# Patient Record
Sex: Female | Born: 1935
Health system: Southern US, Community
[De-identification: ages and names within clinical notes are randomized; demographics above are authoritative.]

## PROBLEM LIST (undated history)

## (undated) DIAGNOSIS — H269 Unspecified cataract: Secondary | ICD-10-CM

## (undated) DIAGNOSIS — I10 Essential (primary) hypertension: Secondary | ICD-10-CM

## (undated) DIAGNOSIS — T7840XA Allergy, unspecified, initial encounter: Secondary | ICD-10-CM

## (undated) HISTORY — PX: FRACTURE SURGERY: SHX138

## (undated) HISTORY — DX: Unspecified cataract: H26.9

## (undated) HISTORY — PX: HERNIA REPAIR: SHX51

## (undated) HISTORY — DX: Allergy, unspecified, initial encounter: T78.40XA

---

## 1999-07-18 ENCOUNTER — Ambulatory Visit (HOSPITAL_BASED_OUTPATIENT_CLINIC_OR_DEPARTMENT_OTHER): Admission: RE | Admit: 1999-07-18 | Discharge: 1999-07-18 | Payer: Self-pay | Admitting: Surgery

## 2010-09-23 ENCOUNTER — Encounter: Admission: RE | Admit: 2010-09-23 | Discharge: 2010-09-23 | Payer: Self-pay | Admitting: Orthopedic Surgery

## 2010-09-26 ENCOUNTER — Ambulatory Visit (HOSPITAL_BASED_OUTPATIENT_CLINIC_OR_DEPARTMENT_OTHER): Admission: RE | Admit: 2010-09-26 | Discharge: 2010-09-26 | Payer: Self-pay | Admitting: Unknown Physician Specialty

## 2011-03-02 LAB — POCT HEMOGLOBIN-HEMACUE: Hemoglobin: 12.4 g/dL (ref 12.0–15.0)

## 2013-12-24 DIAGNOSIS — H259 Unspecified age-related cataract: Secondary | ICD-10-CM | POA: Diagnosis not present

## 2014-02-18 DIAGNOSIS — H2589 Other age-related cataract: Secondary | ICD-10-CM | POA: Diagnosis not present

## 2014-02-18 DIAGNOSIS — H18599 Other hereditary corneal dystrophies, unspecified eye: Secondary | ICD-10-CM | POA: Diagnosis not present

## 2014-03-23 DIAGNOSIS — H18599 Other hereditary corneal dystrophies, unspecified eye: Secondary | ICD-10-CM | POA: Diagnosis not present

## 2015-01-27 DIAGNOSIS — H25813 Combined forms of age-related cataract, bilateral: Secondary | ICD-10-CM | POA: Diagnosis not present

## 2015-01-27 DIAGNOSIS — H1859 Other hereditary corneal dystrophies: Secondary | ICD-10-CM | POA: Diagnosis not present

## 2015-06-24 ENCOUNTER — Ambulatory Visit (INDEPENDENT_AMBULATORY_CARE_PROVIDER_SITE_OTHER): Payer: Medicare Other | Admitting: Physician Assistant

## 2015-06-24 VITALS — BP 180/80 | HR 82 | Temp 97.8°F | Resp 18 | Ht 61.75 in | Wt 156.0 lb

## 2015-06-24 DIAGNOSIS — Z23 Encounter for immunization: Secondary | ICD-10-CM

## 2015-06-24 DIAGNOSIS — IMO0001 Reserved for inherently not codable concepts without codable children: Secondary | ICD-10-CM

## 2015-06-24 DIAGNOSIS — S91209A Unspecified open wound of unspecified toe(s) with damage to nail, initial encounter: Secondary | ICD-10-CM | POA: Diagnosis not present

## 2015-06-24 DIAGNOSIS — M79674 Pain in right toe(s): Secondary | ICD-10-CM

## 2015-06-24 DIAGNOSIS — R030 Elevated blood-pressure reading, without diagnosis of hypertension: Secondary | ICD-10-CM | POA: Diagnosis not present

## 2015-06-24 NOTE — Progress Notes (Signed)
06/25/2015 at 8:20 AM  Angela [name] / DOB: October 13, 1933 / MRN: [id number]  The patient  does not have a problem list on file.  SUBJECTIVE  Chief complaint: toe problem   Patient here today because she was getting out of the bath tube and rammed her toenail into the side of the tub.  Reports the proximal part of the nail was out of place and she just pushed it back down.  She could not control the bleeding and though it best to come here. She did try therapeutic grade lavender as a disinfectant.   She reports her blood pressure is improved today versus what it has been.  She has never been diagnosed with hypertension.  She is working with an Ship brokeracupuncturist and an [place] medicine professional to reduce her pressure. She refuses to take formal medication for this today, and does not want a work up and will not be returning for follow up.   She  has a past medical history of Allergy and Cataract.    Medications reviewed and updated by myself where necessary, and exist elsewhere in the encounter.   Angela [name] has No Known Allergies. She  reports that she has never smoked. She does not have any smokeless tobacco history on file. She reports that she does not drink alcohol or use illicit drugs. She  has no sexual activity history on file. The patient  has past surgical history that includes Fracture surgery and Hernia repair.  Her family history is not on file.  Review of Systems  Constitutional: Negative for fever and chills.  Eyes: Negative for blurred vision.  Cardiovascular: Negative for chest pain.  Gastrointestinal: Negative for nausea.  Skin: Negative for rash.  Neurological: Negative for dizziness and headaches.  Endo/Heme/Allergies: Does not bruise/bleed easily.    OBJECTIVE  Her  height is 5' 1.75" (1.568 m) and weight is 156 lb (70.761 kg). Her oral temperature is 97.8 F (36.6 C). Her blood pressure is 180/80 and her pulse is 82. Her respiration is 18 and oxygen saturation is  97%.  The patient's body mass index is 28.78 kg/(m^2).  Physical Exam  Vitals reviewed. Constitutional: She is oriented to person, place, and time. She appears well-developed and well-nourished. No distress.  Cardiovascular: Normal rate and regular rhythm.   Respiratory: Effort normal.  Musculoskeletal:       Feet:  Neurological: She is alert and oriented to person, place, and time.  Skin: Skin is warm and dry. She is not diaphoretic.   Procedure: Verbal consent obtained.  Patient anesthetized with 2% lidocaine without via digital nerve block. Sterile prep and drape. Right great toenail lifting and removed.  Investigation of the nail bed reveals no lacerations or retained toenail. Xeroform gauze placed.  Two lacerations repaired with 4-0 vicryl and hemostasis achieved.    No results found for this or any previous visit (from the past 24 hour(s)).  ASSESSMENT & PLAN  Angela [name] was seen today for toe problem.  Diagnoses and all orders for this visit:  Nail avulsion, toe, initial encounter Orders: -     Nail removal  Pain of toe of right foot: Managed with problem 1.   Elevated BP:  Patient does not want a workup, medication, or follow up for this problem.  I have strongly advised against doing nothing.    Need for prophylactic vaccination with combined diphtheria-tetanus-pertussis (DTP) vaccine Orders: -     Tdap vaccine greater than or equal to 7yo IM  The patient was advised to call or come back to clinic if she does not see an improvement in symptoms, or worsens with the above plan.   Deliah Boston, MHS, PA-C [facility] 06/25/2015 8:20 AM

## 2016-01-28 DIAGNOSIS — I10 Essential (primary) hypertension: Secondary | ICD-10-CM | POA: Diagnosis not present

## 2016-10-13 DIAGNOSIS — H2513 Age-related nuclear cataract, bilateral: Secondary | ICD-10-CM | POA: Diagnosis not present

## 2017-05-09 ENCOUNTER — Encounter (HOSPITAL_COMMUNITY): Payer: Self-pay | Admitting: Internal Medicine

## 2017-05-09 ENCOUNTER — Emergency Department (HOSPITAL_COMMUNITY): Payer: Medicare Other

## 2017-05-09 ENCOUNTER — Inpatient Hospital Stay (HOSPITAL_COMMUNITY)
Admission: EM | Admit: 2017-05-09 | Discharge: 2017-05-13 | DRG: 469 | Disposition: A | Discharge status: Skilled Nursing Facility | Payer: Medicare Other | Attending: Internal Medicine | Admitting: Internal Medicine

## 2017-05-09 DIAGNOSIS — S72031A Displaced midcervical fracture of right femur, initial encounter for closed fracture: Principal | ICD-10-CM | POA: Diagnosis present

## 2017-05-09 DIAGNOSIS — Z833 Family history of diabetes mellitus: Secondary | ICD-10-CM | POA: Diagnosis not present

## 2017-05-09 DIAGNOSIS — M79604 Pain in right leg: Secondary | ICD-10-CM

## 2017-05-09 DIAGNOSIS — Z823 Family history of stroke: Secondary | ICD-10-CM

## 2017-05-09 DIAGNOSIS — E1165 Type 2 diabetes mellitus with hyperglycemia: Secondary | ICD-10-CM | POA: Diagnosis present

## 2017-05-09 DIAGNOSIS — R0600 Dyspnea, unspecified: Secondary | ICD-10-CM

## 2017-05-09 DIAGNOSIS — R410 Disorientation, unspecified: Secondary | ICD-10-CM | POA: Diagnosis not present

## 2017-05-09 DIAGNOSIS — S72001A Fracture of unspecified part of neck of right femur, initial encounter for closed fracture: Secondary | ICD-10-CM | POA: Diagnosis not present

## 2017-05-09 DIAGNOSIS — E876 Hypokalemia: Secondary | ICD-10-CM | POA: Diagnosis present

## 2017-05-09 DIAGNOSIS — J189 Pneumonia, unspecified organism: Secondary | ICD-10-CM | POA: Diagnosis present

## 2017-05-09 DIAGNOSIS — IMO0002 Reserved for concepts with insufficient information to code with codable children: Secondary | ICD-10-CM

## 2017-05-09 DIAGNOSIS — R221 Localized swelling, mass and lump, neck: Secondary | ICD-10-CM

## 2017-05-09 DIAGNOSIS — Z881 Allergy status to other antibiotic agents status: Secondary | ICD-10-CM | POA: Diagnosis not present

## 2017-05-09 DIAGNOSIS — Y92003 Bedroom of unspecified non-institutional (private) residence as the place of occurrence of the external cause: Secondary | ICD-10-CM | POA: Diagnosis not present

## 2017-05-09 DIAGNOSIS — D72829 Elevated white blood cell count, unspecified: Secondary | ICD-10-CM | POA: Diagnosis present

## 2017-05-09 DIAGNOSIS — W010XXA Fall on same level from slipping, tripping and stumbling without subsequent striking against object, initial encounter: Secondary | ICD-10-CM | POA: Diagnosis present

## 2017-05-09 DIAGNOSIS — R229 Localized swelling, mass and lump, unspecified: Secondary | ICD-10-CM | POA: Diagnosis present

## 2017-05-09 DIAGNOSIS — I248 Other forms of acute ischemic heart disease: Secondary | ICD-10-CM | POA: Diagnosis present

## 2017-05-09 DIAGNOSIS — Z09 Encounter for follow-up examination after completed treatment for conditions other than malignant neoplasm: Secondary | ICD-10-CM

## 2017-05-09 DIAGNOSIS — I10 Essential (primary) hypertension: Secondary | ICD-10-CM | POA: Diagnosis present

## 2017-05-09 DIAGNOSIS — Z9114 Patient's other noncompliance with medication regimen: Secondary | ICD-10-CM

## 2017-05-09 DIAGNOSIS — R748 Abnormal levels of other serum enzymes: Secondary | ICD-10-CM | POA: Diagnosis not present

## 2017-05-09 DIAGNOSIS — S72009A Fracture of unspecified part of neck of unspecified femur, initial encounter for closed fracture: Secondary | ICD-10-CM | POA: Diagnosis present

## 2017-05-09 DIAGNOSIS — R739 Hyperglycemia, unspecified: Secondary | ICD-10-CM

## 2017-05-09 DIAGNOSIS — E039 Hypothyroidism, unspecified: Secondary | ICD-10-CM | POA: Diagnosis not present

## 2017-05-09 DIAGNOSIS — E785 Hyperlipidemia, unspecified: Secondary | ICD-10-CM | POA: Diagnosis present

## 2017-05-09 DIAGNOSIS — I340 Nonrheumatic mitral (valve) insufficiency: Secondary | ICD-10-CM | POA: Diagnosis not present

## 2017-05-09 DIAGNOSIS — E1169 Type 2 diabetes mellitus with other specified complication: Secondary | ICD-10-CM

## 2017-05-09 DIAGNOSIS — R938 Abnormal findings on diagnostic imaging of other specified body structures: Secondary | ICD-10-CM

## 2017-05-09 DIAGNOSIS — R778 Other specified abnormalities of plasma proteins: Secondary | ICD-10-CM | POA: Diagnosis present

## 2017-05-09 DIAGNOSIS — R011 Cardiac murmur, unspecified: Secondary | ICD-10-CM | POA: Diagnosis present

## 2017-05-09 DIAGNOSIS — I700 Atherosclerosis of aorta: Secondary | ICD-10-CM | POA: Diagnosis present

## 2017-05-09 DIAGNOSIS — Z419 Encounter for procedure for purposes other than remedying health state, unspecified: Secondary | ICD-10-CM

## 2017-05-09 DIAGNOSIS — E860 Dehydration: Secondary | ICD-10-CM | POA: Diagnosis not present

## 2017-05-09 DIAGNOSIS — Z01810 Encounter for preprocedural cardiovascular examination: Secondary | ICD-10-CM | POA: Diagnosis not present

## 2017-05-09 DIAGNOSIS — R7989 Other specified abnormal findings of blood chemistry: Secondary | ICD-10-CM | POA: Diagnosis present

## 2017-05-09 DIAGNOSIS — R9389 Abnormal findings on diagnostic imaging of other specified body structures: Secondary | ICD-10-CM | POA: Diagnosis present

## 2017-05-09 HISTORY — DX: Essential (primary) hypertension: I10

## 2017-05-09 LAB — URINALYSIS, ROUTINE W REFLEX MICROSCOPIC
Bilirubin Urine: NEGATIVE
Ketones, ur: NEGATIVE mg/dL
NITRITE: NEGATIVE
PH: 5 (ref 5.0–8.0)
Protein, ur: 100 mg/dL — AB
SPECIFIC GRAVITY, URINE: 1.025 (ref 1.005–1.030)

## 2017-05-09 LAB — BASIC METABOLIC PANEL
ANION GAP: 9 (ref 5–15)
BUN: 38 mg/dL — ABNORMAL HIGH (ref 6–20)
CALCIUM: 9.4 mg/dL (ref 8.9–10.3)
CHLORIDE: 101 mmol/L (ref 101–111)
CO2: 26 mmol/L (ref 22–32)
CREATININE: 0.81 mg/dL (ref 0.44–1.00)
GFR calc Af Amer: >60 mL/min (ref >60)
GFR calc non Af Amer: >60 mL/min (ref >60)
GLUCOSE: 271 mg/dL — AB (ref 65–99)
Potassium: 4.2 mmol/L (ref 3.5–5.1)
Sodium: 136 mmol/L (ref 135–145)

## 2017-05-09 LAB — CBC
HEMATOCRIT: 44.6 % (ref 36.0–46.0)
HEMOGLOBIN: 15.5 g/dL — AB (ref 12.0–15.0)
MCH: 30.9 pg (ref 26.0–34.0)
MCHC: 34.8 g/dL (ref 30.0–36.0)
MCV: 88.8 fL (ref 78.0–100.0)
Platelets: 355 10*3/uL (ref 150–400)
RBC: 5.02 MIL/uL (ref 3.87–5.11)
RDW: 12.7 % (ref 11.5–15.5)
WBC: 18.4 10*3/uL — ABNORMAL HIGH (ref 4.0–10.5)

## 2017-05-09 LAB — TROPONIN I: TROPONIN I: 0.16 ng/mL — AB (ref <0.03)

## 2017-05-09 LAB — GLUCOSE, CAPILLARY: Glucose-Capillary: 337 mg/dL — ABNORMAL HIGH (ref 65–99)

## 2017-05-09 LAB — CK: Total CK: 210 U/L (ref 38–234)

## 2017-05-09 LAB — TSH: TSH: 5.657 u[IU]/mL — ABNORMAL HIGH (ref 0.350–4.500)

## 2017-05-09 LAB — MAGNESIUM: Magnesium: 2.7 mg/dL — ABNORMAL HIGH (ref 1.7–2.4)

## 2017-05-09 LAB — PHOSPHORUS: PHOSPHORUS: 3.5 mg/dL (ref 2.5–4.6)

## 2017-05-09 MED ORDER — DEXTROSE 5 % IV SOLN
1.0000 g | INTRAVENOUS | Status: DC
  Administered 2017-05-09 – 2017-05-11 (×3): 1 g via INTRAVENOUS
  Filled 2017-05-09 (×3): qty 10

## 2017-05-09 MED ORDER — GUAIFENESIN ER 600 MG PO TB12
600.0000 mg | ORAL_TABLET | Freq: Two times a day (BID) | ORAL | Status: DC
  Administered 2017-05-09 – 2017-05-13 (×6): 600 mg via ORAL
  Filled 2017-05-09 (×7): qty 1

## 2017-05-09 MED ORDER — POVIDONE-IODINE 10 % EX SWAB: 2.0000 "application " | Freq: Once | CUTANEOUS | Status: DC

## 2017-05-09 MED ORDER — INSULIN ASPART 100 UNIT/ML ~~LOC~~ SOLN
0.0000 [IU] | SUBCUTANEOUS | Status: DC
  Administered 2017-05-09: 7 [IU] via SUBCUTANEOUS
  Administered 2017-05-10: 3 [IU] via SUBCUTANEOUS
  Administered 2017-05-10: 2 [IU] via SUBCUTANEOUS
  Administered 2017-05-10: 7 [IU] via SUBCUTANEOUS
  Administered 2017-05-10: 1 [IU] via SUBCUTANEOUS
  Administered 2017-05-11 (×2): 2 [IU] via SUBCUTANEOUS
  Administered 2017-05-11: 3 [IU] via SUBCUTANEOUS
  Administered 2017-05-11: 2 [IU] via SUBCUTANEOUS
  Administered 2017-05-11: 3 [IU] via SUBCUTANEOUS
  Administered 2017-05-11 – 2017-05-12 (×5): 2 [IU] via SUBCUTANEOUS
  Administered 2017-05-12: 3 [IU] via SUBCUTANEOUS
  Administered 2017-05-12 – 2017-05-13 (×5): 2 [IU] via SUBCUTANEOUS

## 2017-05-09 MED ORDER — ACETAMINOPHEN 325 MG PO TABS
650.0000 mg | ORAL_TABLET | Freq: Four times a day (QID) | ORAL | Status: DC | PRN
Start: 2017-05-09 — End: 2017-05-13

## 2017-05-09 MED ORDER — DEXTROSE 5 % IV SOLN
500.0000 mg | INTRAVENOUS | Status: DC
  Administered 2017-05-09 – 2017-05-11 (×3): 500 mg via INTRAVENOUS
  Filled 2017-05-09 (×3): qty 500

## 2017-05-09 MED ORDER — PROPOFOL 10 MG/ML IV BOLUS
INTRAVENOUS | Status: AC
  Filled 2017-05-09: qty 20

## 2017-05-09 MED ORDER — FENTANYL CITRATE (PF) 100 MCG/2ML IJ SOLN
INTRAMUSCULAR | Status: AC
  Filled 2017-05-09: qty 2

## 2017-05-09 MED ORDER — IOPAMIDOL (ISOVUE-370) INJECTION 76%
INTRAVENOUS | Status: AC
  Filled 2017-05-09: qty 100

## 2017-05-09 MED ORDER — HYDROCODONE-ACETAMINOPHEN 5-325 MG PO TABS: 1.0000 | ORAL_TABLET | Freq: Four times a day (QID) | ORAL | Status: DC | PRN

## 2017-05-09 MED ORDER — ONDANSETRON HCL 4 MG PO TABS: 4.0000 mg | ORAL_TABLET | Freq: Four times a day (QID) | ORAL | Status: DC | PRN

## 2017-05-09 MED ORDER — ACETAMINOPHEN 650 MG RE SUPP: 650.0000 mg | Freq: Four times a day (QID) | RECTAL | Status: DC | PRN

## 2017-05-09 MED ORDER — METHOCARBAMOL 500 MG PO TABS: 500.0000 mg | ORAL_TABLET | Freq: Four times a day (QID) | ORAL | Status: DC | PRN

## 2017-05-09 MED ORDER — CEFAZOLIN SODIUM-DEXTROSE 2-4 GM/100ML-% IV SOLN: 2.0000 g | INTRAVENOUS | Status: DC

## 2017-05-09 MED ORDER — PREDNISOLONE ACETATE 1 % OP SUSP
1.0000 [drp] | Freq: Four times a day (QID) | OPHTHALMIC | Status: DC
  Administered 2017-05-09 – 2017-05-13 (×8): 1 [drp] via OPHTHALMIC
  Filled 2017-05-09: qty 5

## 2017-05-09 MED ORDER — MIDAZOLAM HCL 2 MG/2ML IJ SOLN
INTRAMUSCULAR | Status: AC
  Filled 2017-05-09: qty 2

## 2017-05-09 MED ORDER — ENSURE ENLIVE PO LIQD
237.0000 mL | Freq: Two times a day (BID) | ORAL | Status: DC
  Administered 2017-05-13: 237 mL via ORAL

## 2017-05-09 MED ORDER — IOPAMIDOL (ISOVUE-370) INJECTION 76%
100.0000 mL | Freq: Once | INTRAVENOUS | Status: AC | PRN
Start: 2017-05-09 — End: 2017-05-09
  Administered 2017-05-09: 100 mL via INTRAVENOUS

## 2017-05-09 MED ORDER — MORPHINE SULFATE (PF) 4 MG/ML IV SOLN: 0.5000 mg | INTRAVENOUS | Status: DC | PRN

## 2017-05-09 MED ORDER — SODIUM CHLORIDE 0.9 % IV SOLN
INTRAVENOUS | Status: AC
  Administered 2017-05-09 – 2017-05-10 (×2): via INTRAVENOUS

## 2017-05-09 MED ORDER — CHLORHEXIDINE GLUCONATE 4 % EX LIQD
60.0000 mL | Freq: Once | CUTANEOUS | Status: AC
  Administered 2017-05-09: 4 via TOPICAL
  Filled 2017-05-09: qty 60

## 2017-05-09 MED ORDER — METHOCARBAMOL 1000 MG/10ML IJ SOLN
500.0000 mg | Freq: Four times a day (QID) | INTRAMUSCULAR | Status: DC | PRN
  Administered 2017-05-10: 500 mg via INTRAVENOUS
  Filled 2017-05-09 (×2): qty 5

## 2017-05-09 MED ORDER — ACETAMINOPHEN 10 MG/ML IV SOLN: 1000.0000 mg | INTRAVENOUS | Status: DC

## 2017-05-09 MED ORDER — ONDANSETRON HCL 4 MG/2ML IJ SOLN: 4.0000 mg | Freq: Four times a day (QID) | INTRAMUSCULAR | Status: DC | PRN

## 2017-05-09 MED ORDER — CEFAZOLIN SODIUM 10 G IJ SOLR: 3.0000 g | INTRAMUSCULAR | Status: DC

## 2017-05-09 MED ORDER — SODIUM CHLORIDE 0.9 % IV BOLUS (SEPSIS)
1000.0000 mL | Freq: Once | INTRAVENOUS | Status: AC
  Administered 2017-05-09: 1000 mL via INTRAVENOUS

## 2017-05-09 MED ORDER — HYDROCODONE-ACETAMINOPHEN 5-325 MG PO TABS: 1.0000 | ORAL_TABLET | ORAL | Status: DC | PRN

## 2017-05-09 MED ORDER — SODIUM CHLORIDE 0.9 % IV SOLN
1000.0000 mg | INTRAVENOUS | Status: DC
  Filled 2017-05-09: qty 10

## 2017-05-09 MED ORDER — SODIUM CHLORIDE 0.9% FLUSH
3.0000 mL | Freq: Two times a day (BID) | INTRAVENOUS | Status: DC
  Administered 2017-05-09: 3 mL via INTRAVENOUS

## 2017-05-09 NOTE — ED Notes (Signed)
Date and time results received: 05/09/17 1950 (use smartphrase ".now" to insert current time)  Test: Troponin Critical Value: Troponin 0.16  Name of Provider Notified: Joselyn Glassmanyler  Orders Received? Or Actions Taken?: None

## 2017-05-09 NOTE — ED Notes (Signed)
Bed: WA21 Expected date:  Expected time:  Means of arrival:  Comments: [age] y/o F fall-laid in floor for 3 days

## 2017-05-09 NOTE — Progress Notes (Signed)
CSW attempted to call [name], the pt's son at [phone] and left VM.  Per the pt, the pt's son lives in [place].  VM indicates this was the phone for [name].  CSW gave the pt her son's number on a piece of paper.  Pt left her phone at her home with relatives numbers.  CSW completed assessment.  [name], Theresia MajorsLCSWA, LCAS Clinical Social Worker Ph: [phone]

## 2017-05-09 NOTE — ED Notes (Signed)
Pt. Documented in error DG Chest 2 View. 

## 2017-05-09 NOTE — ED Notes (Signed)
Writer attempted to get urine from pt w/ a femal urinal.  Unsuccessful X 2

## 2017-05-09 NOTE — ED Provider Notes (Signed)
[facility]-EMERGENCY DEPT Provider Note   CSN: [id number] Arrival date & time: 05/09/17  1511     History   Chief Complaint Chief Complaint  Patient presents with  . Fall    HPI Angela CANADY is a 83 y.o. female.  HPI  83 y.o. female, presents to the Emergency Department today due to mechanical fall. Pt states that she was ambulating her her bedroom when she tripped on the carpet and landed on her right hip. Noted pain with movement and attempted ambulation, but was unable to do so. She remained on the ground for duration of time at home. Pt neighbor noticed newspapers piling up and decided to call EMS who found patient prone on ground. Noted shortening of right leg. Pt states she is not in pain unless the hip is moved. Rates 5/10 and sharp sensation. Denied head trauma or LOC during fall. Pt is not on blood thinners. Pt has not eaten or drank anything since fall that evening. Pt denies CP/SOB/ABD pain. No N/V/D. No fevers. No other symptoms noted  Past Medical History:  Diagnosis Date  . Allergy   . Cataract     There are no active problems to display for this patient.   Past Surgical History:  Procedure Laterality Date  . FRACTURE SURGERY    . HERNIA REPAIR      OB History    No data available       Home Medications    Prior to Admission medications   Not on File    Family History No family history on file.  Social History Social History  Substance Use Topics  . Smoking status: Never Smoker  . Smokeless tobacco: Not on file  . Alcohol use No     Allergies   Patient has no known allergies.   Review of Systems Review of Systems ROS reviewed and all are negative for acute change except as noted in the HPI.  Physical Exam Updated Vital Signs BP (!) 152/76 (BP Location: Right Arm)   Pulse 91   Temp 97.5 F (36.4 C) (Oral)   Resp 18   Ht 5\' 2"  (1.575 m)   SpO2 93%   Physical Exam  Constitutional: She is oriented to person, place, and time. Vital  signs are normal. She appears well-developed and well-nourished.  HENT:  Head: Normocephalic and atraumatic.  Right Ear: Hearing normal.  Left Ear: Hearing normal.  Eyes: Conjunctivae and EOM are normal. Pupils are equal, round, and reactive to light.  Neck: Normal range of motion. Neck supple.  Cardiovascular: Normal rate, regular rhythm and intact distal pulses.   Murmur heard. Pulmonary/Chest: Effort normal and breath sounds normal. She has no decreased breath sounds. She has no wheezes. She has no rhonchi. She has no rales.  Abdominal: Soft. Bowel sounds are normal. There is no tenderness. There is no rigidity, no rebound, no guarding, no CVA tenderness, no tenderness at McBurney's point and negative Murphy's sign.  Musculoskeletal: Normal range of motion.  Mild right leg shortening compared to left. NVI with distal pulses appreciated. Pain with internal/external rotation of right hip join. X3 other extremities with full ROM without pain. No obvious palpable or visible deformities   Neurological: She is alert and oriented to person, place, and time. She has normal strength. No cranial nerve deficit or sensory deficit.  Skin: Skin is warm and dry.  Psychiatric: She has a normal mood and affect. Her speech is normal and behavior is normal. Thought content normal.  Nursing note and vitals reviewed.  ED Treatments / Results  Labs (all labs ordered are listed, but only abnormal results are displayed) Labs Reviewed  CBC - Abnormal; Notable for the following:       Result Value   WBC 18.4 (*)    Hemoglobin 15.5 (*)    All other components within normal limits  BASIC METABOLIC PANEL - Abnormal; Notable for the following:    Glucose, Bld 271 (*)    BUN 38 (*)    All other components within normal limits  CK  URINALYSIS, ROUTINE W REFLEX MICROSCOPIC    EKG  EKG Interpretation  Date/Time:  Wednesday May 09 2017 15:27:11 EDT Ventricular Rate:  94 PR Interval:    QRS Duration: 87 QT  Interval:  357 QTC Calculation: 447 R Axis:   -42 Text Interpretation:  Sinus rhythm Left atrial enlargement Abnormal R-wave progression, early transition Left ventricular hypertrophy Inferior infarct, old When comapred to prior, no significant changes from prior.  No STEMI Confirmed by Theda Belfastegeler, Chris ([id number]) on 05/09/2017 6:08:20 PM       Radiology Dg Chest 2 View  Result Date: 05/09/2017 CLINICAL DATA:  Fall, fell on Sunday evening and was found today in prone position with shortening and rotation of the RIGHT leg, RIGHT hip pain radiating to knee EXAM: CHEST  2 VIEW COMPARISON:  09/23/2010 FINDINGS: Normal heart size, mediastinal contours, and pulmonary vascularity. Scattered atelectasis versus infiltrate in LEFT lung. Unable to exclude LEFT perihilar mass with this appearance. Tiny LEFT pleural effusion blunts the lateral costophrenic angle. Remaining lungs clear. No pneumothorax. Atherosclerotic calcification aorta. Bones demineralized. IMPRESSION: Scattered infiltrate versus atelectasis in LEFT lung, unable to exclude LEFT perihilar mass; when the patient's clinical condition permits, recommend follow-up CT chest with contrast to exclude neoplasm. Electronically Signed   By: Ulyses SouthwardMark  Boles M.D.   On: 05/09/2017 17:47   Dg Knee Complete 4 Views Right  Result Date: 05/09/2017 CLINICAL DATA:  Fall, fell on Sunday evening and was found today in prone position with shortening and rotation of the RIGHT leg, RIGHT hip pain radiating to knee EXAM: RIGHT KNEE - COMPLETE 4+ VIEW COMPARISON:  None FINDINGS: Diffuse osseous demineralization. Medial compartment joint space narrowing spur formation. Mild chondrocalcinosis in medial compartment question CPPD. No acute fracture, dislocation, or bone destruction. No knee joint effusion. IMPRESSION: Osseous demineralization with mild degenerative changes and question CPPD RIGHT knee. No acute abnormalities. Please refer to report of RIGHT hip radiograph exam.  Electronically Signed   By: Ulyses SouthwardMark  Boles M.D.   On: 05/09/2017 17:45   Dg Hip Unilat W Or Wo Pelvis 2-3 Views Right  Result Date: 05/09/2017 CLINICAL DATA:  Fall EXAM: DG HIP (WITH OR WITHOUT PELVIS) 2-3V RIGHT COMPARISON:  None. FINDINGS: Old appearing right inferior pubic ramus fracture. Pubic symphysis is intact. No right femoral head dislocation. Foreshortened appearance of the right femoral neck with suspected transcervical fracture. IMPRESSION: 1. Foreshortened appearance of the right femoral neck with suspected transcervical fracture. 2. Old appearing right inferior pubic ramus fracture Electronically Signed   By: Jasmine PangKim  Fujinaga M.D.   On: 05/09/2017 17:45   Dg Femur, Min 2 Views Right  Result Date: 05/09/2017 CLINICAL DATA:  Fall, fell on Sunday evening and was found today in prone position with shortening and rotation of the RIGHT leg, RIGHT hip pain radiating to knee EXAM: RIGHT FEMUR 2 VIEWS COMPARISON:  None FINDINGS: Osseous mineralization. RIGHT hip imaged and reported separately. Remainder of RIGHT femur appears intact without  fracture or dislocation. Knee joint alignment grossly normal. IMPRESSION: Osseous demineralization without acute abnormalities. Please refer to report of RIGHT hip radiographs. Electronically Signed   By: Ulyses Southward M.D.   On: 05/09/2017 17:44    Procedures Procedures (including critical care time)  Medications Ordered in ED Medications  sodium chloride 0.9 % bolus 1,000 mL (1,000 mLs Intravenous New Bag/Given 05/09/17 1635)   Initial Impression / Assessment and Plan / ED Course  I have reviewed the triage vital signs and the nursing notes.  Pertinent labs & imaging results that were available during my care of the patient were reviewed by me and considered in my medical decision making (see chart for details).  Final Clinical Impressions(s) / ED Diagnoses  {I have reviewed and evaluated the relevant laboratory values. {I have reviewed and evaluated the  relevant imaging studies.  {I have reviewed the relevant previous healthcare records.  {I obtained HPI from historian. {Patient discussed with supervising physician.  ED Course:  Assessment: Pt is a 83 y.o. female who presents with due to mechanical fall on Sunday evening with right hip pain. Noted prone position until EMS arrived today. Denies pain currently. No N/V/D. No CP/SOB/ABD pain. Notes pain with ROM of right hip with internal external rotation. Denies headache/dizziness. On exam, pt in NAD. Nontoxic/nonseptic appearing. VSS. Afebrile. Lungs CTA. Heart RRR. Abdomen nontender soft. Right hip with mild shortening. NVI with distal pulses appreciated. Concern for AKI due to prone positioning and dehydration. CK 210. CBC with leukocytosis noted, likely from acute fracture. BMP unremarkable. DG Right Hip shows right femoral neck fracture with suspected transcervical fracture. CXR shows atelectasis vs infiltrate. Pt afebrile and without cough or shortness of breath. Likely atelectasis. Consult to Orthopedics (Dr. Linna Caprice) will plan on operation tonight. Seen by supervising physician. Plan is to Admit to medicine.   Disposition/Plan:  Admit Pt acknowledges and agrees with plan  Supervising Physician Tegeler, [name] Brim, *  Final diagnoses:  Closed fracture of neck of right femur, initial encounter [facility])    New Prescriptions New Prescriptions   No medications on file     Audry Pili, Cordelia Poche 05/09/17 1850    Tegeler, [name] Brim, MD 05/16/17 1227

## 2017-05-09 NOTE — Clinical Social Work Note (Addendum)
Clinical Social Work Assessment  Patient Details  Name: Angela [name] MRN: [id number] Date of Birth: Nov 28, 1933  Date of referral:  05/09/17               Reason for consult:  Facility Placement                Permission sought to share information with:  Facility Sport and exercise psychologist, Family Supports Permission granted to share information::  Yes, Verbal Permission Granted  Name::        Agency::     Relationship::     Contact Information:     Housing/Transportation Living arrangements for the past 2 months:  Single Family Home Source of Information:  Patient Patient Interpreter Needed:  None Criminal Activity/Legal Involvement Pertinent to Current Situation/Hospitalization:    Significant Relationships:  Adult Children Lives with:  Self Do you feel safe going back to the place where you live?  Yes Need for family participation in patient care:  Yes (Comment)  Care giving concerns:  None listed by pt/family    Social Worker assessment / plan:  CSW met with pt and confirmed pt's plan to be discharged to SNF to live at discharge if PT recommends.  CSW provided active listening and validated pt's concerns that SNF be as near to her home near [address] as possible.  CSW noted [name] seems to be nearest to pt's home   Pt gave CSW Dept permission to complete FL-2 and send referrals out to SNF facilities via the hub per pt's request if PT recommends.  Pt has been living independently prior to being [facility] to [name] at her home since the early 1980's. CSW attempted to call Angela [name], the pt's son at [phone] and left VM.  Per the pt, the pt's son lives in [place].  VM indicates this was the phone for [name]  CSW gave the pt her son's number on a piece of paper.  Pt left her phone at her home with relatives numbers.    Employment status:  Retired Nurse, adult PT Recommendations:  Not assessed at this time Information / Referral  to community resources:     Patient/Family's Response to care:  Patient alert and oriented except fpr being unsure where she was in the hospital and in what city.  This may be attributable to weakness since pt fell on Sunday 5/20 and was only found by EMS on 5/23 when neighbors called EMS due to newspapers piling up outside pt's door .  Patient agreeable to plan.  Pt's son supportive and strongly involved in pt.'s care, per pt.  Pt  pleasant and appreciated CSW intervention.    Patient/Family's Understanding of and Emotional Response to Diagnosis, Current Treatment, and Prognosis: Still assessing   Emotional Assessment Appearance:  Appears stated age Attitude/Demeanor/Rapport:    Affect (typically observed):  Accepting, Adaptable, Calm, Appropriate Orientation:  Oriented to Self, Oriented to  Time, Oriented to Situation Alcohol / Substance use:    Psych involvement (Current and /or in the community):     Discharge Needs  Concerns to be addressed:    Readmission within the last 30 days:  No Current discharge risk:  None Barriers to Discharge:  No Barriers Identified   Angela [name], LCSWA 05/09/2017, 8:55 PM

## 2017-05-09 NOTE — H&P (Addendum)
Angela [name]:[id number] DOB: 10/12/1933 DOA: 05/09/2017     PCP: Patient, No Pcp Per   Outpatient Specialists: none  Patient coming from:  home Lives alone,   Chief Complaint: found down with right hip pain  HPI: Angela [name] is a 83 y.o. female with medical history significant of hypertesion     Presented with a mechanical fall 3 days ago was not able to get up. Apparently she tripped  in her bedroom and landed on her right hip she was unable to walk denies hitting her head or passing out. Patient is not taking any blood ferrous. Neighbor noticed that the newspapers for pilonidal on her door and called EMS patient's been laying in the sitting position since the fall for past 3 days. She was prone had shortening and rotation of the right leg.   Denies any history of chest pain shortness of breath abdominal pain and nausea vomiting no fevers no diarrhea. No dysuria. Patient unsure about her medical problems U records she has been diagnosed with hypertension in the past. Patient has been trying to control her blood pressure with acupuncture and [place] medicine she refuses to take any medications for this.  She reports some shortness of breath and dyspnea of exertion she is unable to walk up a flight of stairs due to shortness of breath. Been going on for the past 1 year.  She has never seen a heart doctor.   Regarding pertinent Chronic problems: Patient has history of hypertension but prefers [place] medicine   IN ER:  Temp (24hrs), Avg:97.5 F (36.4 C), Min:97.5 F (36.4 C), Max:97.5 F (36.4 C)  RR 1892% HR 89  BP 157/79 UA too numerous to count RBC and WBC + glucose, nitrate -, bacteria rare  NA 136  K 4.2  Glucose  271  Cr 0.81 CK 210  WBC 18.4 Hg 15.5  Right Hip: Likely femoral neck fracture Right knee - non acute  CXR: infiltrates vs atelectasis possible perihilar mass Following Medications were ordered in ER: Medications  sodium chloride 0.9 % bolus 1,000 mL  (1,000 mLs Intravenous New Bag/Given 05/09/17 1635)     ER provider discussed case with:Orthopedics who is planing to operate  Hospitalist was called for admission for Hip fracture  Review of Systems:    Pertinent positives include: fall, hip pain  Constitutional:  No weight loss, night sweats, Fevers, chills, fatigue, weight loss  HEENT:  No headaches, Difficulty swallowing,Tooth/dental problems,Sore throat,  No sneezing, itching, ear ache, nasal congestion, post nasal drip,  Cardio-vascular:  No chest pain, Orthopnea, PND, anasarca, dizziness, palpitations.no Bilateral lower extremity swelling  GI:  No heartburn, indigestion, abdominal pain, nausea, vomiting, diarrhea, change in bowel habits, loss of appetite, melena, blood in stool, hematemesis Resp:  no shortness of breath at rest. No dyspnea on exertion, No excess mucus, no productive cough, No non-productive cough, No coughing up of blood.No change in color of mucus.No wheezing. Skin:  no rash or lesions. No jaundice GU:  no dysuria, change in color of urine, no urgency or frequency. No straining to urinate.  No flank pain.  Musculoskeletal:  No joint pain or no joint swelling. No decreased range of motion. No back pain.  Psych:  No change in mood or affect. No depression or anxiety. No memory loss.  Neuro: no localizing neurological complaints, no tingling, no weakness, no double vision, no gait abnormality, no slurred speech, no confusion  As per HPI otherwise 10 point review of  systems negative.   Past Medical History: Past Medical History:  Diagnosis Date  . Allergy   . Cataract    Past Surgical History:  Procedure Laterality Date  . FRACTURE SURGERY    . HERNIA REPAIR       Social History:  Ambulatory independently      reports that she has never smoked. She does not have any smokeless tobacco history on file. She reports that she does not drink alcohol or use drugs.  Allergies:   Allergies    Allergen Reactions  . Epinephrine Other (See Comments)  . Other Other (See Comments)    Allergic to almost all RX-high powered antibiotics, anesthea used at dentist  . Phenylephrine Other (See Comments)    DO NO DILATE WITH PHENYLEPHRINE       Family History:   Family History  Problem Relation Age of Onset  . Stroke Mother   . Diabetes Sister   . CAD Neg Hx   . Hypertension Neg Hx   . Cancer Neg Hx     Medications: Prior to Admission medications   Not on File    Physical Exam: Patient Vitals for the past 24 hrs:  BP Temp Temp src Pulse Resp SpO2 Height  05/09/17 1811 (!) 157/79 - - 89 18 92 % -  05/09/17 1648 (!) 152/76 - - 91 18 93 % -  05/09/17 1528 - - - - - - 5\' 2"  (1.575 m)  05/09/17 1527 (!) 175/73 97.5 F (36.4 C) Oral 94 (!) 27 94 % -  05/09/17 1522 - - - - - 97 % -    1. General:  in No Acute distress 2. Psychological: Alert and   Oriented 3. Head/ENT:    Dry Mucous Membranes                          Head Non traumatic, neck supple                         Poor Dentition                           Pulsatile mass noted in   Anterior/right part of the neck 4. SKIN:  decreased Skin turgor,  Skin clean Dry and intact no rash 5. Heart: Regular rate and rhythm systolic  Murmur, Rub or gallop 6. Lungs: Clear to auscultation bilaterally, no wheezes or crackles   7. Abdomen: Soft, non-tender, Non distended 8. Lower extremities: no clubbing, cyanosis, or edema 9. Neurologically Grossly intact, moving all 4 extremities equally  10. MSK: Normal range of motion limited in right hip   body mass index is unknown because there is no height or weight on file.  Labs on Admission:   Labs on Admission: I have personally reviewed following labs and imaging studies  CBC:  Recent Labs Lab 05/09/17 1553  WBC 18.4*  HGB 15.5*  HCT 44.6  MCV 88.8  PLT 355   Basic Metabolic Panel:  Recent Labs Lab 05/09/17 1553  NA 136  K 4.2  CL 101  CO2 26  GLUCOSE 271*   BUN 38*  CREATININE 0.81  CALCIUM 9.4   GFR: CrCl cannot be calculated (Unknown ideal weight.). Liver Function Tests: No results for input(s): AST, ALT, ALKPHOS, BILITOT, PROT, ALBUMIN in the last 168 hours. No results for input(s): LIPASE, AMYLASE in the last 168 hours. No  results for input(s): AMMONIA in the last 168 hours. Coagulation Profile: No results for input(s): INR, PROTIME in the last 168 hours. Cardiac Enzymes:  Recent Labs Lab 05/09/17 1553  CKTOTAL 210   BNP (last 3 results) No results for input(s): PROBNP in the last 8760 hours. HbA1C: No results for input(s): HGBA1C in the last 72 hours. CBG: No results for input(s): GLUCAP in the last 168 hours. Lipid Profile: No results for input(s): CHOL, HDL, LDLCALC, TRIG, CHOLHDL, LDLDIRECT in the last 72 hours. Thyroid Function Tests: No results for input(s): TSH, T4TOTAL, FREET4, T3FREE, THYROIDAB in the last 72 hours. Anemia Panel: No results for input(s): VITAMINB12, FOLATE, FERRITIN, TIBC, IRON, RETICCTPCT in the last 72 hours. Urine analysis:    Component Value Date/Time   COLORURINE YELLOW 05/09/2017 1554   APPEARANCEUR CLOUDY (A) 05/09/2017 1554   LABSPEC 1.025 05/09/2017 1554   PHURINE 5.0 05/09/2017 1554   GLUCOSEU >=500 (A) 05/09/2017 1554   HGBUR SMALL (A) 05/09/2017 1554   BILIRUBINUR NEGATIVE 05/09/2017 1554   KETONESUR NEGATIVE 05/09/2017 1554   PROTEINUR 100 (A) 05/09/2017 1554   NITRITE NEGATIVE 05/09/2017 1554   LEUKOCYTESUR LARGE (A) 05/09/2017 1554   Sepsis Labs: @LABRCNTIP (procalcitonin:4,lacticidven:4) )No results found for this or any previous visit (from the past 240 hour(s)).    UA  too numerous to count RBC and WBC + glucose, nitrate -, bacteria rare  No results found for: HGBA1C  CrCl cannot be calculated (Unknown ideal weight.).  BNP (last 3 results) No results for input(s): PROBNP in the last 8760 hours.   ECG REPORT  Independently reviewed Rate: 94  Rhythm: NSR  possible LVH ST&T Change: No acute ischemic changes  Poor R wave progression QTC 447  There were no vitals filed for this visit.   Cultures: No results found for: SDES, SPECREQUEST, CULT, REPTSTATUS   Radiological Exams on Admission: Dg Chest 2 View  Result Date: 05/09/2017 CLINICAL DATA:  Fall, fell on Sunday evening and was found today in prone position with shortening and rotation of the RIGHT leg, RIGHT hip pain radiating to knee EXAM: CHEST  2 VIEW COMPARISON:  09/23/2010 FINDINGS: Normal heart size, mediastinal contours, and pulmonary vascularity. Scattered atelectasis versus infiltrate in LEFT lung. Unable to exclude LEFT perihilar mass with this appearance. Tiny LEFT pleural effusion blunts the lateral costophrenic angle. Remaining lungs clear. No pneumothorax. Atherosclerotic calcification aorta. Bones demineralized. IMPRESSION: Scattered infiltrate versus atelectasis in LEFT lung, unable to exclude LEFT perihilar mass; when the patient's clinical condition permits, recommend follow-up CT chest with contrast to exclude neoplasm. Electronically Signed   By: Ulyses Southward M.D.   On: 05/09/2017 17:47   Dg Knee Complete 4 Views Right  Result Date: 05/09/2017 CLINICAL DATA:  Fall, fell on Sunday evening and was found today in prone position with shortening and rotation of the RIGHT leg, RIGHT hip pain radiating to knee EXAM: RIGHT KNEE - COMPLETE 4+ VIEW COMPARISON:  None FINDINGS: Diffuse osseous demineralization. Medial compartment joint space narrowing spur formation. Mild chondrocalcinosis in medial compartment question CPPD. No acute fracture, dislocation, or bone destruction. No knee joint effusion. IMPRESSION: Osseous demineralization with mild degenerative changes and question CPPD RIGHT knee. No acute abnormalities. Please refer to report of RIGHT hip radiograph exam. Electronically Signed   By: Ulyses Southward M.D.   On: 05/09/2017 17:45   Dg Hip Unilat W Or Wo Pelvis 2-3 Views  Right  Result Date: 05/09/2017 CLINICAL DATA:  Fall EXAM: DG HIP (WITH OR WITHOUT PELVIS) 2-3V  RIGHT COMPARISON:  None. FINDINGS: Old appearing right inferior pubic ramus fracture. Pubic symphysis is intact. No right femoral head dislocation. Foreshortened appearance of the right femoral neck with suspected transcervical fracture. IMPRESSION: 1. Foreshortened appearance of the right femoral neck with suspected transcervical fracture. 2. Old appearing right inferior pubic ramus fracture Electronically Signed   By: Jasmine Pang M.D.   On: 05/09/2017 17:45   Dg Femur, Min 2 Views Right  Result Date: 05/09/2017 CLINICAL DATA:  Fall, fell on Sunday evening and was found today in prone position with shortening and rotation of the RIGHT leg, RIGHT hip pain radiating to knee EXAM: RIGHT FEMUR 2 VIEWS COMPARISON:  None FINDINGS: Osseous mineralization. RIGHT hip imaged and reported separately. Remainder of RIGHT femur appears intact without fracture or dislocation. Knee joint alignment grossly normal. IMPRESSION: Osseous demineralization without acute abnormalities. Please refer to report of RIGHT hip radiographs. Electronically Signed   By: Ulyses Southward M.D.   On: 05/09/2017 17:44    Chart has been reviewed    Assessment/Plan   83 y.o. female with medical history significant of hypertesion admitted for right femoral neck fracture after being found down for 3 days without food or water.   Present on Admission: . Fracture of femoral neck, right (HCC) - patient with abnormal EKG elevated troponin and heart murmur endorses dyspnea on exertion and inability to walk up a flight of stairs will need echogram to evaluate for significant valvular disease benefit from cardiology cardiac clearance.  . Essential hypertension - patient unsure if she takes any put pressure medications at home most likely does not.  Will likely benefit from long-term management if she is agreeable   . Heart murmur - will order echo  gram possibly symptomatic valvular disease  . Abnormal CXR -we'll obtain CT of the chest to evaluate for pneumonia versus malignancy given leukocytosis . Pulsatile neck mass -will evaluate with CT of the neck . Hyperglycemia - patient may be undiagnosed diabetic will order hemoglobin A1c check TSH order sliding scale patient has family history of diabetes and is obese Leukocytosis probably hemoconcentration but cannot rule out underlying infection We will try to evaluate father for possibility of pneumonia UTI being less likely being the patient is asymptomatic Elevated troponin - patient denies any current chest pain possibly demand ischemia in the setting of dehydration Dehydration would minister IV fluids and monitor urine output Other plan as per orders. Elevated TSH will check T4 and T3 levels and treat if abnormal DVT prophylaxis:  SCD    Code Status:  FULL CODE   as per patient    Family Communication:   Family not  at  Bedside    Disposition Plan:   likely will need placement for rehabilitation                                                     Would benefit from PT/OT eval prior to DC  Order when stable                       Social Work   Diabetes coordinator    Nutrition  consult                          Consults called: Orthopedics [place] aware of  the patient, email cardiology   Admission status:  inpatient       Level of care    tele        I have spent a total of 56 min on this admission   Robie Oats 05/09/2017, 8:04 PM    [facility]  Pager [phone]   after 2 AM please page floor coverage PA If 7AM-7PM, please contact the day team taking care of the patient  [email or web address]  Password [email or web address]

## 2017-05-09 NOTE — ED Triage Notes (Signed)
Per EMS, pt is coming from home after experiencing a fall. The pt neighbor noticed the newspapers were piling up at her door. Pt reports falling on Sunday evening and laying in the same position since the fall. EMS reports finding pt in a prone position with shortening/rotation of the right leg. Pt is AOx4.

## 2017-05-10 ENCOUNTER — Inpatient Hospital Stay (HOSPITAL_COMMUNITY): Payer: Medicare Other | Admitting: Certified Registered Nurse Anesthetist

## 2017-05-10 ENCOUNTER — Inpatient Hospital Stay (HOSPITAL_COMMUNITY): Payer: Medicare Other

## 2017-05-10 ENCOUNTER — Encounter (HOSPITAL_COMMUNITY): Payer: Self-pay | Admitting: Anesthesiology

## 2017-05-10 ENCOUNTER — Encounter (HOSPITAL_COMMUNITY)
Admission: EM | Disposition: A | Discharge status: Skilled Nursing Facility | Payer: Self-pay | Source: Home / Self Care | Attending: Internal Medicine

## 2017-05-10 DIAGNOSIS — S72001A Fracture of unspecified part of neck of right femur, initial encounter for closed fracture: Secondary | ICD-10-CM

## 2017-05-10 DIAGNOSIS — R011 Cardiac murmur, unspecified: Secondary | ICD-10-CM

## 2017-05-10 DIAGNOSIS — I248 Other forms of acute ischemic heart disease: Secondary | ICD-10-CM

## 2017-05-10 DIAGNOSIS — I340 Nonrheumatic mitral (valve) insufficiency: Secondary | ICD-10-CM

## 2017-05-10 DIAGNOSIS — Z01810 Encounter for preprocedural cardiovascular examination: Secondary | ICD-10-CM

## 2017-05-10 DIAGNOSIS — J189 Pneumonia, unspecified organism: Secondary | ICD-10-CM

## 2017-05-10 HISTORY — PX: HIP ARTHROPLASTY: SHX981

## 2017-05-10 LAB — GLUCOSE, CAPILLARY
GLUCOSE-CAPILLARY: 122 mg/dL — AB (ref 65–99)
GLUCOSE-CAPILLARY: 178 mg/dL — AB (ref 65–99)
GLUCOSE-CAPILLARY: 206 mg/dL — AB (ref 65–99)
GLUCOSE-CAPILLARY: 210 mg/dL — AB (ref 65–99)
GLUCOSE-CAPILLARY: 229 mg/dL — AB (ref 65–99)
GLUCOSE-CAPILLARY: 430 mg/dL — AB (ref 65–99)
Glucose-Capillary: 210 mg/dL — ABNORMAL HIGH (ref 65–99)
Glucose-Capillary: 206 mg/dL — ABNORMAL HIGH (ref 65–99)

## 2017-05-10 LAB — CBC
HCT: 41.5 % (ref 36.0–46.0)
HEMOGLOBIN: 14.1 g/dL (ref 12.0–15.0)
MCH: 30.6 pg (ref 26.0–34.0)
MCHC: 34 g/dL (ref 30.0–36.0)
MCV: 90 fL (ref 78.0–100.0)
PLATELETS: 321 10*3/uL (ref 150–400)
RBC: 4.61 MIL/uL (ref 3.87–5.11)
RDW: 12.8 % (ref 11.5–15.5)
WBC: 15.9 10*3/uL — AB (ref 4.0–10.5)

## 2017-05-10 LAB — TROPONIN I
TROPONIN I: 0.13 ng/mL — AB (ref <0.03)
TROPONIN I: 0.18 ng/mL — AB (ref <0.03)
Troponin I: 0.15 ng/mL (ref <0.03)

## 2017-05-10 LAB — PHOSPHORUS: PHOSPHORUS: 2.7 mg/dL (ref 2.5–4.6)

## 2017-05-10 LAB — LIPID PANEL
CHOL/HDL RATIO: 3 ratio
CHOLESTEROL: 250 mg/dL — AB (ref 0–200)
HDL: 82 mg/dL (ref >40)
LDL Cholesterol: 143 mg/dL — ABNORMAL HIGH (ref 0–99)
Triglycerides: 125 mg/dL (ref <150)
VLDL: 25 mg/dL (ref 0–40)

## 2017-05-10 LAB — MAGNESIUM: Magnesium: 2.3 mg/dL (ref 1.7–2.4)

## 2017-05-10 LAB — COMPREHENSIVE METABOLIC PANEL
ALK PHOS: 59 U/L (ref 38–126)
ALT: 24 U/L (ref 14–54)
AST: 27 U/L (ref 15–41)
Albumin: 3.4 g/dL — ABNORMAL LOW (ref 3.5–5.0)
Anion gap: 9 (ref 5–15)
BILIRUBIN TOTAL: 0.6 mg/dL (ref 0.3–1.2)
BUN: 36 mg/dL — AB (ref 6–20)
CALCIUM: 9.3 mg/dL (ref 8.9–10.3)
CO2: 26 mmol/L (ref 22–32)
CREATININE: 0.76 mg/dL (ref 0.44–1.00)
Chloride: 104 mmol/L (ref 101–111)
GFR calc Af Amer: >60 mL/min (ref >60)
Glucose, Bld: 208 mg/dL — ABNORMAL HIGH (ref 65–99)
POTASSIUM: 3.6 mmol/L (ref 3.5–5.1)
Sodium: 139 mmol/L (ref 135–145)
TOTAL PROTEIN: 6.5 g/dL (ref 6.5–8.1)

## 2017-05-10 LAB — SURGICAL PCR SCREEN
MRSA, PCR: NEGATIVE
STAPHYLOCOCCUS AUREUS: NEGATIVE

## 2017-05-10 LAB — TYPE AND SCREEN
ABO/RH(D): A NEG
Antibody Screen: NEGATIVE

## 2017-05-10 LAB — TSH: TSH: 8.315 u[IU]/mL — ABNORMAL HIGH (ref 0.350–4.500)

## 2017-05-10 LAB — ABO/RH: ABO/RH(D): A NEG

## 2017-05-10 LAB — STREP PNEUMONIAE URINARY ANTIGEN: Strep Pneumo Urinary Antigen: NEGATIVE

## 2017-05-10 LAB — HIV ANTIBODY (ROUTINE TESTING W REFLEX): HIV SCREEN 4TH GENERATION: NONREACTIVE

## 2017-05-10 LAB — GLUCOSE, RANDOM: GLUCOSE: 349 mg/dL — AB (ref 65–99)

## 2017-05-10 LAB — HEMOGLOBIN A1C
Hgb A1c MFr Bld: 7 % — ABNORMAL HIGH (ref 4.8–5.6)
MEAN PLASMA GLUCOSE: 154 mg/dL

## 2017-05-10 LAB — ECHOCARDIOGRAM COMPLETE
Height: 62 in
WEIGHTICAEL: 2349.22 [oz_av]

## 2017-05-10 LAB — T4, FREE: FREE T4: 0.96 ng/dL (ref 0.61–1.12)

## 2017-05-10 SURGERY — HEMIARTHROPLASTY, HIP, DIRECT ANTERIOR APPROACH, FOR FRACTURE
Anesthesia: General | Site: Hip | Laterality: Right

## 2017-05-10 MED ORDER — LACTATED RINGERS IV SOLN
INTRAVENOUS | Status: DC | PRN
  Administered 2017-05-10 (×2): via INTRAVENOUS

## 2017-05-10 MED ORDER — TRANEXAMIC ACID 1000 MG/10ML IV SOLN: INTRAVENOUS | Status: DC | PRN

## 2017-05-10 MED ORDER — METOCLOPRAMIDE HCL 5 MG PO TABS
5.0000 mg | ORAL_TABLET | Freq: Three times a day (TID) | ORAL | Status: DC | PRN
  Filled 2017-05-10: qty 2

## 2017-05-10 MED ORDER — SUGAMMADEX SODIUM 200 MG/2ML IV SOLN
INTRAVENOUS | Status: AC
  Filled 2017-05-10: qty 2

## 2017-05-10 MED ORDER — ORAL CARE MOUTH RINSE
15.0000 mL | Freq: Two times a day (BID) | OROMUCOSAL | Status: DC
  Administered 2017-05-12: 15 mL via OROMUCOSAL

## 2017-05-10 MED ORDER — FENTANYL CITRATE (PF) 250 MCG/5ML IJ SOLN
INTRAMUSCULAR | Status: AC
  Filled 2017-05-10: qty 5

## 2017-05-10 MED ORDER — SODIUM CHLORIDE 0.9 % IV SOLN
INTRAVENOUS | Status: DC
  Administered 2017-05-11: 06:00:00 via INTRAVENOUS

## 2017-05-10 MED ORDER — ATORVASTATIN CALCIUM 20 MG PO TABS
20.0000 mg | ORAL_TABLET | Freq: Every day | ORAL | Status: DC
  Administered 2017-05-12: 20 mg via ORAL
  Filled 2017-05-10: qty 1

## 2017-05-10 MED ORDER — TRANEXAMIC ACID 1000 MG/10ML IV SOLN
1000.0000 mg | INTRAVENOUS | Status: DC
  Filled 2017-05-10: qty 10

## 2017-05-10 MED ORDER — CEFAZOLIN SODIUM-DEXTROSE 2-3 GM-% IV SOLR
INTRAVENOUS | Status: DC | PRN
  Administered 2017-05-10: 2 g via INTRAVENOUS

## 2017-05-10 MED ORDER — PHENYLEPHRINE 40 MCG/ML (10ML) SYRINGE FOR IV PUSH (FOR BLOOD PRESSURE SUPPORT)
PREFILLED_SYRINGE | INTRAVENOUS | Status: AC
  Filled 2017-05-10: qty 10

## 2017-05-10 MED ORDER — FENTANYL CITRATE (PF) 100 MCG/2ML IJ SOLN
INTRAMUSCULAR | Status: DC | PRN
  Administered 2017-05-10 (×6): 50 ug via INTRAVENOUS

## 2017-05-10 MED ORDER — PHENOL 1.4 % MT LIQD: 1.0000 | OROMUCOSAL | Status: DC | PRN

## 2017-05-10 MED ORDER — SODIUM CHLORIDE 0.9 % IV SOLN
INTRAVENOUS | Status: DC
  Administered 2017-05-10: 17:00:00 via INTRAVENOUS

## 2017-05-10 MED ORDER — KETOROLAC TROMETHAMINE 30 MG/ML IJ SOLN
INTRAMUSCULAR | Status: AC
  Filled 2017-05-10: qty 1

## 2017-05-10 MED ORDER — SODIUM CHLORIDE 0.9 % IR SOLN
Status: DC | PRN
  Administered 2017-05-10: 1000 mL

## 2017-05-10 MED ORDER — SODIUM CHLORIDE 0.9 % IJ SOLN
INTRAMUSCULAR | Status: AC
  Filled 2017-05-10: qty 50

## 2017-05-10 MED ORDER — ROCURONIUM BROMIDE 10 MG/ML (PF) SYRINGE
PREFILLED_SYRINGE | INTRAVENOUS | Status: DC | PRN
  Administered 2017-05-10: 50 mg via INTRAVENOUS

## 2017-05-10 MED ORDER — PROPOFOL 10 MG/ML IV BOLUS
INTRAVENOUS | Status: AC
  Filled 2017-05-10: qty 20

## 2017-05-10 MED ORDER — MENTHOL 3 MG MT LOZG: 1.0000 | LOZENGE | OROMUCOSAL | Status: DC | PRN

## 2017-05-10 MED ORDER — CEFAZOLIN SODIUM-DEXTROSE 2-4 GM/100ML-% IV SOLN
INTRAVENOUS | Status: AC
  Filled 2017-05-10: qty 100

## 2017-05-10 MED ORDER — PROPOFOL 10 MG/ML IV BOLUS
INTRAVENOUS | Status: DC | PRN
  Administered 2017-05-10: 20 mg via INTRAVENOUS
  Administered 2017-05-10: 160 mg via INTRAVENOUS

## 2017-05-10 MED ORDER — DEXAMETHASONE SODIUM PHOSPHATE 10 MG/ML IJ SOLN
INTRAMUSCULAR | Status: AC
  Filled 2017-05-10: qty 1

## 2017-05-10 MED ORDER — CEFAZOLIN SODIUM-DEXTROSE 2-4 GM/100ML-% IV SOLN
2.0000 g | Freq: Four times a day (QID) | INTRAVENOUS | Status: AC
  Administered 2017-05-10 – 2017-05-11 (×2): 2 g via INTRAVENOUS
  Filled 2017-05-10 (×2): qty 100

## 2017-05-10 MED ORDER — CHLORHEXIDINE GLUCONATE 0.12 % MT SOLN
15.0000 mL | Freq: Two times a day (BID) | OROMUCOSAL | Status: DC
  Administered 2017-05-10 – 2017-05-13 (×6): 15 mL via OROMUCOSAL
  Filled 2017-05-10 (×6): qty 15

## 2017-05-10 MED ORDER — ONDANSETRON HCL 4 MG/2ML IJ SOLN
INTRAMUSCULAR | Status: DC | PRN
  Administered 2017-05-10: 4 mg via INTRAVENOUS

## 2017-05-10 MED ORDER — POLYETHYLENE GLYCOL 3350 17 G PO PACK
17.0000 g | PACK | Freq: Every day | ORAL | Status: DC | PRN
  Filled 2017-05-10: qty 1

## 2017-05-10 MED ORDER — LEVOTHYROXINE SODIUM 50 MCG PO TABS
50.0000 ug | ORAL_TABLET | Freq: Every day | ORAL | Status: DC
  Administered 2017-05-11 – 2017-05-13 (×3): 50 ug via ORAL
  Filled 2017-05-10 (×3): qty 1

## 2017-05-10 MED ORDER — ASPIRIN EC 81 MG PO TBEC
81.0000 mg | DELAYED_RELEASE_TABLET | Freq: Two times a day (BID) | ORAL | Status: DC
  Administered 2017-05-11 – 2017-05-13 (×3): 81 mg via ORAL
  Filled 2017-05-10 (×3): qty 1

## 2017-05-10 MED ORDER — TRANEXAMIC ACID 1000 MG/10ML IV SOLN
INTRAVENOUS | Status: DC | PRN
  Administered 2017-05-10: 1000 mg via INTRAVENOUS

## 2017-05-10 MED ORDER — SODIUM CHLORIDE 0.9 % IJ SOLN
INTRAMUSCULAR | Status: DC | PRN
  Administered 2017-05-10: 30 mL

## 2017-05-10 MED ORDER — ACETAMINOPHEN 10 MG/ML IV SOLN
INTRAVENOUS | Status: DC | PRN
  Administered 2017-05-10: 1000 mg via INTRAVENOUS

## 2017-05-10 MED ORDER — MIDAZOLAM HCL 2 MG/2ML IJ SOLN
INTRAMUSCULAR | Status: AC
  Filled 2017-05-10: qty 2

## 2017-05-10 MED ORDER — KETOROLAC TROMETHAMINE 30 MG/ML IJ SOLN
INTRAMUSCULAR | Status: DC | PRN
  Administered 2017-05-10: 30 mg

## 2017-05-10 MED ORDER — BUPIVACAINE HCL (PF) 0.25 % IJ SOLN
INTRAMUSCULAR | Status: DC | PRN
  Administered 2017-05-10: 30 mL

## 2017-05-10 MED ORDER — LIDOCAINE 2% (20 MG/ML) 5 ML SYRINGE
INTRAMUSCULAR | Status: DC | PRN
  Administered 2017-05-10: 80 mg via INTRAVENOUS

## 2017-05-10 MED ORDER — PHENYLEPHRINE HCL 10 MG/ML IJ SOLN
INTRAMUSCULAR | Status: DC | PRN
  Administered 2017-05-10: 80 ug via INTRAVENOUS

## 2017-05-10 MED ORDER — METOCLOPRAMIDE HCL 5 MG/ML IJ SOLN: 5.0000 mg | Freq: Three times a day (TID) | INTRAMUSCULAR | Status: DC | PRN

## 2017-05-10 MED ORDER — BUPIVACAINE HCL (PF) 0.25 % IJ SOLN
INTRAMUSCULAR | Status: AC
  Filled 2017-05-10: qty 30

## 2017-05-10 MED ORDER — HYDROMORPHONE HCL 1 MG/ML IJ SOLN: 0.2500 mg | INTRAMUSCULAR | Status: DC | PRN

## 2017-05-10 MED ORDER — SUGAMMADEX SODIUM 200 MG/2ML IV SOLN
INTRAVENOUS | Status: DC | PRN
  Administered 2017-05-10: 150 mg via INTRAVENOUS

## 2017-05-10 MED ORDER — ACETAMINOPHEN 10 MG/ML IV SOLN
INTRAVENOUS | Status: AC
  Filled 2017-05-10: qty 100

## 2017-05-10 MED ORDER — FENTANYL CITRATE (PF) 100 MCG/2ML IJ SOLN
INTRAMUSCULAR | Status: AC
  Filled 2017-05-10: qty 2

## 2017-05-10 MED ORDER — ISOPROPYL ALCOHOL 70 % SOLN
Status: DC | PRN
  Administered 2017-05-10: 1 via TOPICAL

## 2017-05-10 MED ORDER — ONDANSETRON HCL 4 MG/2ML IJ SOLN
INTRAMUSCULAR | Status: AC
Start: 2017-05-10 — End: 2017-05-10
  Filled 2017-05-10: qty 2

## 2017-05-10 MED ORDER — PROMETHAZINE HCL 25 MG/ML IJ SOLN: 6.2500 mg | INTRAMUSCULAR | Status: DC | PRN

## 2017-05-10 MED ORDER — AMLODIPINE BESYLATE 5 MG PO TABS
2.5000 mg | ORAL_TABLET | Freq: Every day | ORAL | Status: DC
  Administered 2017-05-10 – 2017-05-11 (×2): 2.5 mg via ORAL
  Filled 2017-05-10 (×2): qty 1

## 2017-05-10 MED ORDER — ISOPROPYL ALCOHOL 70 % SOLN
Status: AC
  Filled 2017-05-10: qty 480

## 2017-05-10 MED ORDER — TRANEXAMIC ACID 1000 MG/10ML IV SOLN
2000.0000 mg | Freq: Once | INTRAVENOUS | Status: DC
  Filled 2017-05-10 (×2): qty 20

## 2017-05-10 SURGICAL SUPPLY — 44 items
ADH SKN CLS APL DERMABOND .7 (GAUZE/BANDAGES/DRESSINGS) ×2
BAG SPEC THK2 15X12 ZIP CLS (MISCELLANEOUS)
BAG ZIPLOCK 12X15 (MISCELLANEOUS) IMPLANT
CAPT HIP TOTAL 2 ×2 IMPLANT
CHLORAPREP W/TINT 26ML (MISCELLANEOUS) ×3 IMPLANT
CLOTH BEACON ORANGE TIMEOUT ST (SAFETY) ×3 IMPLANT
COVER PERINEAL POST (MISCELLANEOUS) ×3 IMPLANT
COVER SURGICAL LIGHT HANDLE (MISCELLANEOUS) ×3 IMPLANT
DECANTER SPIKE VIAL GLASS SM (MISCELLANEOUS) ×3 IMPLANT
DERMABOND ADVANCED (GAUZE/BANDAGES/DRESSINGS) ×4
DERMABOND ADVANCED .7 DNX12 (GAUZE/BANDAGES/DRESSINGS) ×2 IMPLANT
DRAPE SHEET LG 3/4 BI-LAMINATE (DRAPES) ×6 IMPLANT
DRAPE STERI IOBAN 125X83 (DRAPES) ×3 IMPLANT
DRAPE U-SHAPE 47X51 STRL (DRAPES) ×6 IMPLANT
DRSG AQUACEL AG ADV 3.5X10 (GAUZE/BANDAGES/DRESSINGS) ×3 IMPLANT
DRSG TEGADERM 4X4.75 (GAUZE/BANDAGES/DRESSINGS) IMPLANT
ELECT PENCIL ROCKER SW 15FT (MISCELLANEOUS) ×3 IMPLANT
ELECT REM PT RETURN 15FT ADLT (MISCELLANEOUS) ×6 IMPLANT
EVACUATOR 1/8 PVC DRAIN (DRAIN) IMPLANT
GAUZE SPONGE 2X2 8PLY STRL LF (GAUZE/BANDAGES/DRESSINGS) IMPLANT
GAUZE SPONGE 4X4 12PLY STRL (GAUZE/BANDAGES/DRESSINGS) ×3 IMPLANT
GLOVE BIO SURGEON STRL SZ8.5 (GLOVE) ×6 IMPLANT
GLOVE BIOGEL PI IND STRL 8.5 (GLOVE) ×1 IMPLANT
GLOVE BIOGEL PI INDICATOR 8.5 (GLOVE) ×2
GOWN SPEC L3 XXLG W/TWL (GOWN DISPOSABLE) ×3 IMPLANT
HANDPIECE INTERPULSE COAX TIP (DISPOSABLE)
HOOD PEEL AWAY FLYTE STAYCOOL (MISCELLANEOUS) ×3 IMPLANT
MANIFOLD NEPTUNE II (INSTRUMENTS) ×3 IMPLANT
MARKER SKIN DUAL TIP RULER LAB (MISCELLANEOUS) ×3 IMPLANT
NDL SPNL 18GX3.5 QUINCKE PK (NEEDLE) ×1 IMPLANT
NEEDLE SPNL 18GX3.5 QUINCKE PK (NEEDLE) ×3 IMPLANT
PACK ANTERIOR HIP CUSTOM (KITS) ×3 IMPLANT
SAW OSC TIP CART 19.5X105X1.3 (SAW) ×3 IMPLANT
SEALER BIPOLAR AQUA 6.0 (INSTRUMENTS) IMPLANT
SET HNDPC FAN SPRY TIP SCT (DISPOSABLE) IMPLANT
SOL PREP POV-IOD 4OZ 10% (MISCELLANEOUS) ×3 IMPLANT
SPONGE GAUZE 2X2 STER 10/PKG (GAUZE/BANDAGES/DRESSINGS)
SUT ETHIBOND NAB CT1 #1 30IN (SUTURE) ×6 IMPLANT
SUT MNCRL AB 3-0 PS2 18 (SUTURE) ×3 IMPLANT
SUT MON AB 2-0 CT1 36 (SUTURE) ×6 IMPLANT
SUT VIC AB 2-0 CT1 27 (SUTURE) ×3
SUT VIC AB 2-0 CT1 TAPERPNT 27 (SUTURE) ×1 IMPLANT
SUT VLOC 180 0 24IN GS25 (SUTURE) ×3 IMPLANT
SYR 50ML LL SCALE MARK (SYRINGE) IMPLANT

## 2017-05-10 NOTE — Progress Notes (Signed)
Inpatient Diabetes Program Recommendations  AACE/ADA: New Consensus Statement on Inpatient Glycemic Control (2015)  Target Ranges:  Prepandial:   less than 140 mg/dL      Peak postprandial:   less than 180 mg/dL (1-2 hours)      Critically ill patients:  140 - 180 mg/dL   Results for Angela [name], Angela [name] (MRN [id number]) as of 05/10/2017 08:17  Ref. Range 05/09/2017 15:53  Hemoglobin A1C Latest Ref Range: 4.8 - 5.6 % 7.0 (H)   Results for Angela [name], Angela [name] (MRN [id number]) as of 05/10/2017 08:17  Ref. Range 05/09/2017 23:16 05/10/2017 00:23 05/10/2017 04:51 05/10/2017 07:35  Glucose-Capillary Latest Ref Range: 65 - 99 mg/dL 782337 (H) 956430 (H) 213178 (H) 122 (H)    Admit with: Hip Fracture  History: NO History of DM noted in H&P  Current Insulin Orders: Novolog Sensitive Correction Scale/ SSI (0-9 units) Q4 hours      MD- Do not see that patient has a prior History of DM.  Is this a new diagnosis?  Current Hemoglobin A1c of 7% and Hyperglycemia on admission point to positive diagnosis of DM per ADA Standards of Care.  If this is a new diagnosis of DM, please make sure to address diagnosis with patient and add "Diabetes" to Active Problem List on patient's chart.  Will speak with patient about her blood sugars once she has completed her surgery.     --Will follow patient during hospitalization--  Ambrose FinlandJeannine Johnston Angela Kluender RN, MSN, CDE Diabetes Coordinator Inpatient Glycemic Control Team Team Pager: [phone] (8a-5p)

## 2017-05-10 NOTE — Progress Notes (Signed)
PT Cancellation Note  Patient Details Name: Angela [name] MRN: [id number] DOB: Dec 12, 1933   Cancelled Treatment:    Reason Eval/Treat Not Completed: Patient not medically ready. Pt awaiting surgery-scheduled for later today. Will sign off and await orders/recommendations from ortho MD. Thanks.    [name], MPT Pager: [phone]

## 2017-05-10 NOTE — Transfer of Care (Signed)
Immediate Anesthesia Transfer of Care Note  Patient: Angela [name]  Procedure(s) Performed: Procedure(s): ARTHROPLASTY  ANTERIOR APPROACH RIGHT HIP (Right)  Patient Location: PACU  Anesthesia Type:General  Level of Consciousn sedate  Airway & Oxygen Therapy: Patient Spontanous Breathing and Patient connected to face mask oxygen  Post-op Assessment: Report given to RN and Post -op Vital signs reviewed and stable  Post vital signs: stable  Last Vitals:  Vitals:   05/10/17 0205 05/10/17 0455  BP: (!) 147/70 (!) 150/61  Pulse: 92 85  Resp: 16 14  Temp: 37.1 C 36.6 C    Last Pain:  Vitals:   05/10/17 0455  TempSrc: Oral         Complications: No apparent anesthesia complications

## 2017-05-10 NOTE — Evaluation (Signed)
SLP Cancellation Note  Patient Details Name: Cinda Questnn C Mcculloh MRN: [id number] DOB: 01-26-1933   Cancelled treatment:       Reason Eval/Treat Not Completed: Other (comment) (pt scheduled for surgery later today, pt npo)   Donavan Burnetamara Orlondo [place], MS [facility] SLP [phone]

## 2017-05-10 NOTE — Progress Notes (Signed)
Pt. CBG 430. On call NP paged for stat lab verification. Will continue to monitor pt.

## 2017-05-10 NOTE — Anesthesia Preprocedure Evaluation (Signed)
Anesthesia Evaluation  Patient identified by MRN, date of birth, ID band Patient awake    Reviewed: Allergy & Precautions, NPO status , Patient's Chart, lab work & pertinent test results  Airway Mallampati: II  TM Distance: >3 FB Neck ROM: Full    Dental no notable dental hx.    Pulmonary neg pulmonary ROS,    Pulmonary exam normal breath sounds clear to auscultation       Cardiovascular hypertension, negative cardio ROS Normal cardiovascular exam+ Valvular Problems/Murmurs  Rhythm:Regular Rate:Normal     Neuro/Psych negative neurological ROS  negative psych ROS   GI/Hepatic negative GI ROS, Neg liver ROS,   Endo/Other  negative endocrine ROS  Renal/GU negative Renal ROS  negative genitourinary   Musculoskeletal negative musculoskeletal ROS (+)   Abdominal   Peds negative pediatric ROS (+)  Hematology negative hematology ROS (+)   Anesthesia Other Findings   Reproductive/Obstetrics negative OB ROS                             Anesthesia Physical Anesthesia Plan  ASA: II  Anesthesia Plan: General   Post-op Pain Management:    Induction: Intravenous  Airway Management Planned: Oral ETT  Additional Equipment:   Intra-op Plan:   Post-operative Plan: Extubation in OR  Informed Consent: I have reviewed the patients History and Physical, chart, labs and discussed the procedure including the risks, benefits and alternatives for the proposed anesthesia with the patient or authorized representative who has indicated his/her understanding and acceptance.   Dental advisory given  Plan Discussed with: CRNA  Anesthesia Plan Comments:         Anesthesia Quick Evaluation

## 2017-05-10 NOTE — Progress Notes (Signed)
Psychosocial assessment completed by ED CSW on 05/09/17. CSW will follow for possible SNF placement. Surgery planned for today. CSW will meet with pt following surgery  / PT recommendations.  Cori RazorJamie Pandora Mccrackin LCSW [phone]

## 2017-05-10 NOTE — Discharge Instructions (Signed)
°Dr. Pietro Bonura °Joint Replacement Specialist °[facility] °[address] °[place], [place] [address] °[phone] ° ° °TOTAL HIP REPLACEMENT POSTOPERATIVE DIRECTIONS ° ° ° °Hip Rehabilitation, Guidelines Following Surgery  ° °WEIGHT BEARING °Weight bearing as tolerated with assist device (walker, cane, etc) as directed, use it as long as suggested by your surgeon or therapist, typically at least 4-6 weeks. ° °The results of a hip operation are greatly improved after range of motion and muscle strengthening exercises. Follow all safety measures which are given to protect your hip. If any of these exercises cause increased pain or swelling in your joint, decrease the amount until you are comfortable again. Then slowly increase the exercises. Call your caregiver if you have problems or questions.  ° °HOME CARE INSTRUCTIONS  °Most of the following instructions are designed to prevent the dislocation of your new hip.  °Remove items at home which could result in a fall. This includes throw rugs or furniture in walking pathways.  °Continue medications as instructed at time of discharge. °· You may have some home medications which will be placed on hold until you complete the course of blood thinner medication. °· You may start showering once you are discharged home. Do not remove your dressing. °Do not put on socks or shoes without following the instructions of your caregivers.   °Sit on chairs with arms. Use the chair arms to help push yourself up when arising.  °Arrange for the use of a toilet seat elevator so you are not sitting low.  °· Walk with walker as instructed.  °You may resume a sexual relationship in one month or when given the OK by your caregiver.  °Use walker as long as suggested by your caregivers.  °You may put full weight on your legs and walk as much as is comfortable. °Avoid periods of inactivity such as sitting longer than an hour when not asleep. This helps prevent  blood clots.  °You may return to work once you are cleared by your surgeon.  °Do not drive a car for 6 weeks or until released by your surgeon.  °Do not drive while taking narcotics.  °Wear elastic stockings for two weeks following surgery during the day but you may remove then at night.  °Make sure you keep all of your appointments after your operation with all of your doctors and caregivers. You should call the office at the above phone number and make an appointment for approximately two weeks after the date of your surgery. °Please pick up a stool softener and laxative for home use as long as you are requiring pain medications. °· ICE to the affected hip every three hours for 30 minutes at a time and then as needed for pain and swelling. Continue to use ice on the hip for pain and swelling from surgery. You may notice swelling that will progress down to the foot and ankle.  This is normal after surgery.  Elevate the leg when you are not up walking on it.   °It is important for you to complete the blood thinner medication as prescribed by your doctor. °· Continue to use the breathing machine which will help keep your temperature down.  It is common for your temperature to cycle up and down following surgery, especially at night when you are not up moving around and exerting yourself.  The breathing machine keeps your lungs expanded and your temperature down. ° °RANGE OF MOTION AND STRENGTHENING EXERCISES  °These exercises are   designed to help you keep full movement of your hip joint. Follow your caregiver's or physical therapist's instructions. Perform all exercises about fifteen times, three times per day or as directed. Exercise both hips, even if you have had only one joint replacement. These exercises can be done on a training (exercise) mat, on the floor, on a table or on a bed. Use whatever works the best and is most comfortable for you. Use music or television while you are exercising so that the exercises  are a pleasant break in your day. This will make your life better with the exercises acting as a break in routine you can look forward to.  °Lying on your back, slowly slide your foot toward your buttocks, raising your knee up off the floor. Then slowly slide your foot back down until your leg is straight again.  °Lying on your back spread your legs as far apart as you can without causing discomfort.  °Lying on your side, raise your upper leg and foot straight up from the floor as far as is comfortable. Slowly lower the leg and repeat.  °Lying on your back, tighten up the muscle in the front of your thigh (quadriceps muscles). You can do this by keeping your leg straight and trying to raise your heel off the floor. This helps strengthen the largest muscle supporting your knee.  °Lying on your back, tighten up the muscles of your buttocks both with the legs straight and with the knee bent at a comfortable angle while keeping your heel on the floor.  ° °SKILLED REHAB INSTRUCTIONS: °If the patient is transferred to a skilled rehab facility following release from the hospital, a list of the current medications will be sent to the facility for the patient to continue.  When discharged from the skilled rehab facility, please have the facility set up the patient's Home Health Physical Therapy prior to being released. Also, the skilled facility will be responsible for providing the patient with their medications at time of release from the facility to include their pain medication and their blood thinner medication. If the patient is still at the rehab facility at time of the two week follow up appointment, the skilled rehab facility will also need to assist the patient in arranging follow up appointment in our office and any transportation needs. ° °MAKE SURE YOU:  °Understand these instructions.  °Will watch your condition.  °Will get help right away if you are not doing well or get worse. ° °Pick up stool softner and  laxative for home use following surgery while on pain medications. °Do not remove your dressing. °The dressing is waterproof--it is OK to take showers. °Continue to use ice for pain and swelling after surgery. °Do not use any lotions or creams on the incision until instructed by your surgeon. °Total Hip Protocol. ° ° °

## 2017-05-10 NOTE — Progress Notes (Signed)
Consult received. Plan for surgery today: R hip hemiarthroplasty vs THA if medically ready. Cont NPO. Hold chemical DVT ppx.

## 2017-05-10 NOTE — Progress Notes (Signed)
Angela [name] ID: Angela [name], female   DOB: [birth year], 83 y.o.   MRN: [id number]  PROGRESS NOTE    Angela [name]  WUJ:[id number] DOB: [birth year] DOA: 05/09/2017 PCP: Angela [name], Angela [name]   Brief Narrative:  83 y.o. female with medical history significant of hypertesion  presented with a mechanical fall 3 days ago and was not able to get up. She was found to have right femoral neck fracture and slightly positive troponins. Orthopedics and cardiology were consulted.   Assessment & Plan:   Active Problems:   Fracture of femoral neck, right (HCC)   Essential hypertension   Hip fracture (HCC)   Heart murmur   Abnormal CXR   Pulsatile neck mass   Hyperglycemia   Elevated troponin   Leukocytosis   CAP (community acquired pneumonia)   Elevated TSH   1. Right femoral neck fracture: present on admission most likely secondary to fall - Orthopedics has been consulted. Probable plan for surgery today. Continue nothing by mouth  2. Mechanical fall: Fall precautions  3. Positive troponins: Probably send her demand ischemia. Follow-up echo and cardiology evaluation and recommendations for perioperative cardiac clearance.  4. Hypertension: Monitor blood pressure. Continue amlodipine  5. Leukocytosis: Probably from community acquired pneumonia, improving. Repeat a.m. Labs. Continue antibiotics.   6. Hyperglycemia: Most likely from undiagnosed diabetes. Abdomen A1c is 7. Continue with SSI. Consult diabetes coordinator  7. Dehydration: Continue IV fluids  8. Community acquired pneumonia: Follow-up cultures. Continue Rocephin and Zithromax.  9. Dyslipidemia: We'll start statins  10. Hypothyroidism: Start levothyroxine  DVT prophylaxis: SCDs Code Status:  Full Family Communication: None present at bedside Disposition Plan:will depend on clinical/surgical outcome  Consultants: Orthopedics and cardiology   Procedures: Echo 05/10/2017: Ejection fraction of 65-70% with grade 1 diastolic  dysfunction  Antimicrobials: Rocephin and Zithromax from 05/09/2017 Subjective: Angela [name] seen and examined at bedside. She is slightly awake but does not answer much questions. She is a poor historian. Currently denies any chest pain or hip pain.  Objective: Vitals:   05/09/17 2000 05/09/17 2149 05/10/17 0205 05/10/17 0455  BP: 123/79 (!) 170/72 (!) 147/70 (!) 150/61  Pulse: 100 89 92 85  Resp: 14 16 16 14   Temp:  98.6 F (37 C) 98.8 F (37.1 C) 97.9 F (36.6 C)  TempSrc:  Oral Oral Oral  SpO2: 91% 95% 94% 93%  Weight:  66.6 kg (146 lb 13.2 oz)    Height:  5\' 2"  (1.575 m)      Intake/Output Summary (Last 24 hours) at 05/10/17 1305 Last data filed at 05/10/17 0600  Gross [name] 24 hour  Intake              900 ml  Output              950 ml  Net              -50 ml   Filed Weights   05/09/17 2149  Weight: 66.6 kg (146 lb 13.2 oz)    Examination:  General exam: Appears calm and comfortable  Respiratory system: Bilateral decreased breath sound at bases With scattered crackles Cardiovascular system: S1 & S2 heard, rate controlled  Gastrointestinal system: Abdomen is nondistended, soft and nontender. Normal bowel sounds heard. Central nervous system: Awake but does not answer much questions. Mild confusion. Angela focal neurological deficits. Moving extremities Extremities: Angela cyanosis, clubbing, trace pedal edema  Skin: Angela rashes, lesions or ulcers Lymph: Angela cervical lymphadenopathy    Data Reviewed: I  have personally reviewed following labs and imaging studies  CBC:  Recent Labs Lab 05/09/17 1553 05/10/17 0358  WBC 18.4* 15.9*  HGB 15.5* 14.1  HCT 44.6 41.5  MCV 88.8 90.0  PLT 355 321   Basic Metabolic Panel:  Recent Labs Lab 05/09/17 1553 05/10/17 0103 05/10/17 0358  NA 136  --  139  K 4.2  --  3.6  CL 101  --  104  CO2 26  --  26  GLUCOSE 271* 349* 208*  BUN 38*  --  36*  CREATININE 0.81  --  0.76  CALCIUM 9.4  --  9.3  MG 2.7*  --  2.3  PHOS 3.5  --   2.7   GFR: Estimated Creatinine Clearance: 48.5 mL/min (by C-G formula based on SCr of 0.76 mg/dL). Liver Function Tests:  Recent Labs Lab 05/10/17 0358  AST 27  ALT 24  ALKPHOS 59  BILITOT 0.6  PROT 6.5  ALBUMIN 3.4*   Angela results for input(s): LIPASE, AMYLASE in the last 168 hours. Angela results for input(s): AMMONIA in the last 168 hours. Coagulation Profile: Angela results for input(s): INR, PROTIME in the last 168 hours. Cardiac Enzymes:  Recent Labs Lab 05/09/17 1553 05/09/17 2335 05/10/17 0358 05/10/17 0956  CKTOTAL 210  --   --   --   TROPONINI 0.16* 0.18* 0.13* 0.15*   BNP (last 3 results) Angela results for input(s): PROBNP in the last 8760 hours. HbA1C:  Recent Labs  05/09/17 1553  HGBA1C 7.0*   CBG:  Recent Labs Lab 05/09/17 2316 05/10/17 0023 05/10/17 0451 05/10/17 0735 05/10/17 1138  GLUCAP 337* 430* 178* 122* 210*   Lipid Profile:  Recent Labs  05/10/17 0358  CHOL 250*  HDL 82  LDLCALC 143*  TRIG 125  CHOLHDL 3.0   Thyroid Function Tests:  Recent Labs  05/09/17 2335 05/10/17 0358  TSH  --  8.315*  FREET4 0.96  --    Anemia Panel: Angela results for input(s): VITAMINB12, FOLATE, FERRITIN, TIBC, IRON, RETICCTPCT in the last 72 hours. Sepsis Labs: Angela results for input(s): PROCALCITON, LATICACIDVEN in the last 168 hours.  Recent Results (from the past 240 hour(s))  Surgical PCR screen     Status: None   Collection Time: 05/09/17 11:07 PM  Result Value Ref Range Status   MRSA, PCR NEGATIVE NEGATIVE Final   Staphylococcus aureus NEGATIVE NEGATIVE Final    Comment:        The Xpert SA Assay (FDA approved for NASAL specimens in patients over 65 years of age), is one component of a comprehensive surveillance program.  Test performance has been validated by [facility] for patients greater than or equal to 61 year old. It is not intended to diagnose infection nor to guide or monitor treatment.          Radiology Studies: Dg  Chest 2 View  Result Date: 05/09/2017 CLINICAL DATA:  Fall, fell on Sunday evening and was found today in prone position with shortening and rotation of the RIGHT leg, RIGHT hip pain radiating to knee EXAM: CHEST  2 VIEW COMPARISON:  09/23/2010 FINDINGS: Normal heart size, mediastinal contours, and pulmonary vascularity. Scattered atelectasis versus infiltrate in LEFT lung. Unable to exclude LEFT perihilar mass with this appearance. Tiny LEFT pleural effusion blunts the lateral costophrenic angle. Remaining lungs clear. Angela pneumothorax. Atherosclerotic calcification aorta. Bones demineralized. IMPRESSION: Scattered infiltrate versus atelectasis in LEFT lung, unable to exclude LEFT perihilar mass; when the Angela [name]'s clinical condition permits, recommend  follow-up CT chest with contrast to exclude neoplasm. Electronically Signed   By: Ulyses Southward M.D.   On: 05/09/2017 17:47   Ct Angio Neck W And/or Wo Contrast  Result Date: 05/09/2017 CLINICAL DATA:  Angela [name] found down and side her house after multiple days. Recent fall EXAM: CT ANGIOGRAPHY NECK TECHNIQUE: Multidetector CT imaging of the neck was performed using the standard protocol during bolus administration of intravenous contrast. Multiplanar CT image reconstructions and MIPs were obtained to evaluate the vascular anatomy. Carotid stenosis measurements (when applicable) are obtained utilizing NASCET criteria, using the distal internal carotid diameter as the denominator. CONTRAST:  100 mL Isovue 370 COMPARISON:  None. FINDINGS: Aortic arch: There is atherosclerotic calcification of the aortic arch without stenosis. There is a normal 3 vessel branching pattern. Both subclavian arteries are widely patent. Right carotid system: The right common carotid artery is normal. The right internal carotid artery is normal without stenosis. Left carotid system: The left common carotid artery and internal carotid artery are normal. Vertebral arteries: Vertebral arteries  are mildly right-dominant. Both vertebral artery origins are normal. The remainder of the vertebral arteries are normal to their confluence at the basilar artery. Skeleton: Multilevel facet hypertrophy but Angela bony spinal canal stenosis. Angela lytic or blastic lesions. Other neck: Thyroid is normal. Angela cervical lymphadenopathy. Salivary glands are normal. Angela focal abnormality of the pharynx or larynx. Upper chest: Please see dedicated report for CTA of the chest. IMPRESSION: 1. Angela aneurysm, dissection or hemodynamically significant stenosis of the carotid or vertebral arteries. 2. Aortic atherosclerosis. 3. Please see dedicated report for CTA of the chest for findings below the thoracic inlet. Electronically Signed   By: Deatra Robinson M.D.   On: 05/09/2017 21:22   Ct Angio Chest Pe W Or Wo Contrast  Result Date: 05/09/2017 CLINICAL DATA:  Fall, abnormal chest x-ray EXAM: CT ANGIOGRAPHY CHEST WITH CONTRAST TECHNIQUE: Multidetector CT imaging of the chest was performed using the standard protocol during bolus administration of intravenous contrast. Multiplanar CT image reconstructions and MIPs were obtained to evaluate the vascular anatomy. CONTRAST:  100 mL Isovue 370 intravenous COMPARISON:  Radiograph 05/09/2017 FINDINGS: Cardiovascular: Satisfactory opacification of the pulmonary arteries to the segmental level. Angela evidence of pulmonary embolism. Non aneurysmal aorta. Angela dissection. Aortic atherosclerosis. Mild coronary artery calcification. Borderline heart size. Angela pericardial effusion. Mediastinum/Nodes: Angela enlarged mediastinal, hilar, or axillary lymph nodes. Thyroid gland, trachea, and esophagus demonstrate Angela significant findings. Lungs/Pleura: Mild consolidation in the lingula. Angela pleural effusion or pneumothorax. Upper Abdomen: Partially visualized 2.5 cm low-density lesion in the left upper quadrant. Musculoskeletal: Angela chest wall abnormality. Angela acute or significant osseous findings. Review of the MIP  images confirms the above findings. IMPRESSION: 1. Negative for acute pulmonary embolus or aortic dissection. Angela evidence for left hilar mass 2. Partially visualized 2.5 cm low-attenuation lesion left upper quadrant possibly a cyst from the kidney. Electronically Signed   By: Jasmine Pang M.D.   On: 05/09/2017 21:20   Dg Knee Complete 4 Views Right  Result Date: 05/09/2017 CLINICAL DATA:  Fall, fell on Sunday evening and was found today in prone position with shortening and rotation of the RIGHT leg, RIGHT hip pain radiating to knee EXAM: RIGHT KNEE - COMPLETE 4+ VIEW COMPARISON:  None FINDINGS: Diffuse osseous demineralization. Medial compartment joint space narrowing spur formation. Mild chondrocalcinosis in medial compartment question CPPD. Angela acute fracture, dislocation, or bone destruction. Angela knee joint effusion. IMPRESSION: Osseous demineralization with mild degenerative changes and question CPPD  RIGHT knee. Angela acute abnormalities. Please refer to report of RIGHT hip radiograph exam. Electronically Signed   By: Ulyses SouthwardMark  Boles M.D.   On: 05/09/2017 17:45   Dg Hip Unilat W Or Wo Pelvis 2-3 Views Right  Result Date: 05/09/2017 CLINICAL DATA:  Fall EXAM: DG HIP (WITH OR WITHOUT PELVIS) 2-3V RIGHT COMPARISON:  None. FINDINGS: Old appearing right inferior pubic ramus fracture. Pubic symphysis is intact. Angela right femoral head dislocation. Foreshortened appearance of the right femoral neck with suspected transcervical fracture. IMPRESSION: 1. Foreshortened appearance of the right femoral neck with suspected transcervical fracture. 2. Old appearing right inferior pubic ramus fracture Electronically Signed   By: Jasmine PangKim  Fujinaga M.D.   On: 05/09/2017 17:45   Dg Femur, Min 2 Views Right  Result Date: 05/09/2017 CLINICAL DATA:  Fall, fell on Sunday evening and was found today in prone position with shortening and rotation of the RIGHT leg, RIGHT hip pain radiating to knee EXAM: RIGHT FEMUR 2 VIEWS COMPARISON:   None FINDINGS: Osseous mineralization. RIGHT hip imaged and reported separately. Remainder of RIGHT femur appears intact without fracture or dislocation. Knee joint alignment grossly normal. IMPRESSION: Osseous demineralization without acute abnormalities. Please refer to report of RIGHT hip radiographs. Electronically Signed   By: Ulyses SouthwardMark  Boles M.D.   On: 05/09/2017 17:44        Scheduled Meds: . amLODipine  2.5 mg Oral Daily  . chlorhexidine  15 mL Mouth Rinse BID  . feeding supplement (ENSURE ENLIVE)  237 mL Oral BID BM  . guaiFENesin  600 mg Oral BID  . insulin aspart  0-9 Units Subcutaneous Q4H  . mouth rinse  15 mL Mouth Rinse q12n4p  . prednisoLONE acetate  1 drop Right Eye QID  . sodium chloride flush  3 mL Intravenous Q12H   Continuous Infusions: . acetaminophen    . azithromycin Stopped (05/10/17 0021)  . ceFAZolin    . cefTRIAXone (ROCEPHIN)  IV Stopped (05/09/17 2350)  . methocarbamol (ROBAXIN)  IV       LOS: 1 day        Glade LloydKshitiz Elza Sortor, MD [facility] Pager [phone]  If 7PM-7AM, please contact night-coverage [email or web address] Password [email or web address] 05/10/2017, 1:05 PM

## 2017-05-10 NOTE — Consult Note (Signed)
Cardiology Consult    Patient ID: Angela [name] MRN: [id number], DOB/AGE: November 13, 1933   Admit date: 05/09/2017 Date of Consult: 05/10/2017  Primary Physician: Patient, No Pcp Per Reason for Consult: Preoperative Cardiac Clearance Primary Cardiologist: New to [facility] Requesting Provider: Dr. Adela Glimpse   History of Present Illness    Angela [name] is a 83 y.o. female with past medical history of HTN who is being seen today for the evaluation of preoperative cardiac clearance at the request of Dr. Adela Glimpse.   In talking with the patient today, she is only A&Ox1 (self). She is unable to tell me where she is currently located. Confused this morning, pulling out her IV's and removing her telemetry box. Therefore, most history is obtained by review of the chart. She denies any current pain. When asked about past medical history, she states "the doctors always tell me I am healthy".   She presented to [facility] ED on 05/09/2017 after being found down at home. Reported falling at home on Sunday due to tripping on a carpet and landing on her right hip. She was unable to get up over the past 3 days and her neighbor noticed her mail was piling up, therefore he called EMS.   Initial labs show WBC of 18.4, Hgb 15.5, platelets 355. Creatinine 0.81. Hgb A1c 7.0. CK 210. Cyclic troponin values have been at 0.16, 0.18, and 0.13. CXR showing scattered infiltrate versus atelectasis in LEFT lung, unable to exclude LEFT perihilar mass. CTA showing no evidence of a PE or dissection. Aortic atherosclerosis and mild coronary artery calcification noted. Plain film shows a right femoral neck with suspected transcervical fx. EKG shows NSR, HR 83, LAD, and inferior Q-waves (no tracings prior to this admission available for comparison).   She has been admitted and started on IVF in the setting of dehydration. Orthopedics has been consulted and are planning for a right hip hemiarthroplasty vs. total hip arthroplasty later  today.   Past Medical History   Past Medical History:  Diagnosis Date  . Allergy   . Cataract   . Hypertension     Past Surgical History:  Procedure Laterality Date  . FRACTURE SURGERY    . HERNIA REPAIR       Allergies  Allergies  Allergen Reactions  . Epinephrine Other (See Comments)  . Other Other (See Comments)    Allergic to almost all RX-high powered antibiotics, anesthea used at dentist Pt states she can't breath if she eats tomatoes. She states she is allergic to other vegetables but does not know the name of them.  . Phenylephrine Other (See Comments)    DO NO DILATE WITH PHENYLEPHRINE    Inpatient Medications    . chlorhexidine  15 mL Mouth Rinse BID  . feeding supplement (ENSURE ENLIVE)  237 mL Oral BID BM  . guaiFENesin  600 mg Oral BID  . insulin aspart  0-9 Units Subcutaneous Q4H  . iopamidol      . mouth rinse  15 mL Mouth Rinse q12n4p  . povidone-iodine  2 application Topical Once  . prednisoLONE acetate  1 drop Right Eye QID  . sodium chloride flush  3 mL Intravenous Q12H    Family History    Family History  Problem Relation Age of Onset  . Stroke Mother   . Diabetes Sister   . CAD Neg Hx   . Hypertension Neg Hx   . Cancer Neg Hx    Unable to be further assessed  due to the patient's altered mental status.  Social History    Social History   Social History  . Marital status: Divorced    Spouse name: N/A  . Number of children: N/A  . Years of education: N/A   Occupational History  . Not on file.   Social History Main Topics  . Smoking status: Never Smoker  . Smokeless tobacco: Never Used  . Alcohol use No  . Drug use: No  . Sexual activity: Not on file   Other Topics Concern  . Not on file   Social History Narrative  . No narrative on file     Review of Systems    General:  No chills, fever, night sweats or weight changes. Positive for recent fall.  Cardiovascular:  No chest pain, dyspnea on exertion, edema, orthopnea,  palpitations, paroxysmal nocturnal dyspnea. Dermatological: No rash, lesions/masses Respiratory: No cough, dyspnea MSK: Positive for right hip pain.  Urologic: No hematuria, dysuria Abdominal:   No nausea, vomiting, diarrhea, bright red blood per rectum, melena, or hematemesis Neurologic:  No visual changes, wkns, changes in mental status. All other systems reviewed and are otherwise negative except as noted above.  Physical Exam    Blood pressure (!) 150/61, pulse 85, temperature 97.9 F (36.6 C), temperature source Oral, resp. rate 14, height 5\' 2"  (1.575 m), weight 146 lb 13.2 oz (66.6 kg), SpO2 93 %.  General: Pleasant, elderly Caucasian female appearing in NAD Psych: Normal affect. Neuro: Alert and oriented X 3. Moves all extremities spontaneously. HEENT: Normal  Neck: Supple without bruits or JVD. Lungs:  Resp regular and unlabored, CTA without wheezing or rales. Heart: RRR no s3, s4, 2/6 SEM along RUSB. Abdomen: Soft, non-tender, non-distended, BS + x 4.  Extremities: No clubbing, cyanosis or lower extremity edema. DP/PT/Radials 2+ and equal bilaterally.  Labs    Troponin (Point of Care Test) No results for input(s): TROPIPOC in the last 72 hours.  Recent Labs  05/09/17 1553 05/09/17 2335 05/10/17 0358  CKTOTAL 210  --   --   TROPONINI 0.16* 0.18* 0.13*   Lab Results  Component Value Date   WBC 15.9 (H) 05/10/2017   HGB 14.1 05/10/2017   HCT 41.5 05/10/2017   MCV 90.0 05/10/2017   PLT 321 05/10/2017    Recent Labs Lab 05/10/17 0358  NA 139  K 3.6  CL 104  CO2 26  BUN 36*  CREATININE 0.76  CALCIUM 9.3  PROT 6.5  BILITOT 0.6  ALKPHOS 59  ALT 24  AST 27  GLUCOSE 208*   Lab Results  Component Value Date   CHOL 250 (H) 05/10/2017   HDL 82 05/10/2017   LDLCALC 143 (H) 05/10/2017   TRIG 125 05/10/2017   No results found for: [facility]   Radiology Studies    Dg Chest 2 View  Result Date: 05/09/2017 CLINICAL DATA:  Fall, fell on Sunday evening and  was found today in prone position with shortening and rotation of the RIGHT leg, RIGHT hip pain radiating to knee EXAM: CHEST  2 VIEW COMPARISON:  09/23/2010 FINDINGS: Normal heart size, mediastinal contours, and pulmonary vascularity. Scattered atelectasis versus infiltrate in LEFT lung. Unable to exclude LEFT perihilar mass with this appearance. Tiny LEFT pleural effusion blunts the lateral costophrenic angle. Remaining lungs clear. No pneumothorax. Atherosclerotic calcification aorta. Bones demineralized. IMPRESSION: Scattered infiltrate versus atelectasis in LEFT lung, unable to exclude LEFT perihilar mass; when the patient's clinical condition permits, recommend follow-up CT chest with contrast to  exclude neoplasm. Electronically Signed   By: Ulyses SouthwardMark  Boles M.D.   On: 05/09/2017 17:47   Ct Angio Neck W And/or Wo Contrast  Result Date: 05/09/2017 CLINICAL DATA:  Patient found down and side her house after multiple days. Recent fall EXAM: CT ANGIOGRAPHY NECK TECHNIQUE: Multidetector CT imaging of the neck was performed using the standard protocol during bolus administration of intravenous contrast. Multiplanar CT image reconstructions and MIPs were obtained to evaluate the vascular anatomy. Carotid stenosis measurements (when applicable) are obtained utilizing NASCET criteria, using the distal internal carotid diameter as the denominator. CONTRAST:  100 mL Isovue 370 COMPARISON:  None. FINDINGS: Aortic arch: There is atherosclerotic calcification of the aortic arch without stenosis. There is a normal 3 vessel branching pattern. Both subclavian arteries are widely patent. Right carotid system: The right common carotid artery is normal. The right internal carotid artery is normal without stenosis. Left carotid system: The left common carotid artery and internal carotid artery are normal. Vertebral arteries: Vertebral arteries are mildly right-dominant. Both vertebral artery origins are normal. The remainder of the  vertebral arteries are normal to their confluence at the basilar artery. Skeleton: Multilevel facet hypertrophy but no bony spinal canal stenosis. No lytic or blastic lesions. Other neck: Thyroid is normal. No cervical lymphadenopathy. Salivary glands are normal. No focal abnormality of the pharynx or larynx. Upper chest: Please see dedicated report for CTA of the chest. IMPRESSION: 1. No aneurysm, dissection or hemodynamically significant stenosis of the carotid or vertebral arteries. 2. Aortic atherosclerosis. 3. Please see dedicated report for CTA of the chest for findings below the thoracic inlet. Electronically Signed   By: Deatra RobinsonKevin  Herman M.D.   On: 05/09/2017 21:22   Ct Angio Chest Pe W Or Wo Contrast  Result Date: 05/09/2017 CLINICAL DATA:  Fall, abnormal chest x-ray EXAM: CT ANGIOGRAPHY CHEST WITH CONTRAST TECHNIQUE: Multidetector CT imaging of the chest was performed using the standard protocol during bolus administration of intravenous contrast. Multiplanar CT image reconstructions and MIPs were obtained to evaluate the vascular anatomy. CONTRAST:  100 mL Isovue 370 intravenous COMPARISON:  Radiograph 05/09/2017 FINDINGS: Cardiovascular: Satisfactory opacification of the pulmonary arteries to the segmental level. No evidence of pulmonary embolism. Non aneurysmal aorta. No dissection. Aortic atherosclerosis. Mild coronary artery calcification. Borderline heart size. No pericardial effusion. Mediastinum/Nodes: No enlarged mediastinal, hilar, or axillary lymph nodes. Thyroid gland, trachea, and esophagus demonstrate no significant findings. Lungs/Pleura: Mild consolidation in the lingula. No pleural effusion or pneumothorax. Upper Abdomen: Partially visualized 2.5 cm low-density lesion in the left upper quadrant. Musculoskeletal: No chest wall abnormality. No acute or significant osseous findings. Review of the MIP images confirms the above findings. IMPRESSION: 1. Negative for acute pulmonary embolus or  aortic dissection. No evidence for left hilar mass 2. Partially visualized 2.5 cm low-attenuation lesion left upper quadrant possibly a cyst from the kidney. Electronically Signed   By: Jasmine PangKim  Fujinaga M.D.   On: 05/09/2017 21:20   Dg Knee Complete 4 Views Right  Result Date: 05/09/2017 CLINICAL DATA:  Fall, fell on Sunday evening and was found today in prone position with shortening and rotation of the RIGHT leg, RIGHT hip pain radiating to knee EXAM: RIGHT KNEE - COMPLETE 4+ VIEW COMPARISON:  None FINDINGS: Diffuse osseous demineralization. Medial compartment joint space narrowing spur formation. Mild chondrocalcinosis in medial compartment question CPPD. No acute fracture, dislocation, or bone destruction. No knee joint effusion. IMPRESSION: Osseous demineralization with mild degenerative changes and question CPPD RIGHT knee. No acute abnormalities. Please  refer to report of RIGHT hip radiograph exam. Electronically Signed   By: Ulyses Southward M.D.   On: 05/09/2017 17:45   Dg Hip Unilat W Or Wo Pelvis 2-3 Views Right  Result Date: 05/09/2017 CLINICAL DATA:  Fall EXAM: DG HIP (WITH OR WITHOUT PELVIS) 2-3V RIGHT COMPARISON:  None. FINDINGS: Old appearing right inferior pubic ramus fracture. Pubic symphysis is intact. No right femoral head dislocation. Foreshortened appearance of the right femoral neck with suspected transcervical fracture. IMPRESSION: 1. Foreshortened appearance of the right femoral neck with suspected transcervical fracture. 2. Old appearing right inferior pubic ramus fracture Electronically Signed   By: Jasmine Pang M.D.   On: 05/09/2017 17:45   Dg Femur, Min 2 Views Right  Result Date: 05/09/2017 CLINICAL DATA:  Fall, fell on Sunday evening and was found today in prone position with shortening and rotation of the RIGHT leg, RIGHT hip pain radiating to knee EXAM: RIGHT FEMUR 2 VIEWS COMPARISON:  None FINDINGS: Osseous mineralization. RIGHT hip imaged and reported separately. Remainder of  RIGHT femur appears intact without fracture or dislocation. Knee joint alignment grossly normal. IMPRESSION: Osseous demineralization without acute abnormalities. Please refer to report of RIGHT hip radiographs. Electronically Signed   By: Ulyses Southward M.D.   On: 05/09/2017 17:44    EKG & Cardiac Imaging    EKG: NSR, HR 83, LAD, and inferior Q-waves (no tracings prior to this admission available for comparison) - Personally Reviewed  Echocardiogram: Pending  Assessment & Plan    1. Preoperative Cardiac Clearance for Right Hip Repair  - the patient presented to [facility] after being found down for over 3 days following a mechanical fall. Unfortunately, she is confused (pulling out her IV, removing telemetry box) at this time and history is limited. By review of prior notes, she reported having baseline dyspnea on exertion but denied any chest discomfort. She reports no prior cardiac history and none is visible by review of Epic. Her son is traveling from out of state and is hoping to be here later today.  - it is difficult to distinguish her cardiac history and current functional status in the setting of her AMS. Based off of objective findings, she does have a murmur on examination, elevated troponin values, and an abnormal baseline EKG. I have contacted echo and asked them to perform her echocardiogram this AM if possible so we have a better idea of any structural abnormalities as she is currently scheduled for surgery later this afternoon if her medical status allows. She would at least be of moderate-risk for surgery at this time, but without surgery, her functional status would be minimal.   2. Systolic Murmur - appreciated on examination, most prominent along RUSB, concerning for AS but does not sound severe. - echocardiogram is pending to assess for structural abnormalities.  3. Elevated Troponin - cyclic troponin values have been flat at 0.16, 0.18, and 0.13. Does not represent ACS, most likely  secondary to demand ischemia in the setting of dehydration. - echocardiogram is pending.   4. HTN - BP has been variable at 120/61 - 175/103 since admission. No known PTA medications.  - will start low-dose Amlodipine 2.5mg  daily.   Signed, Ellsworth Lennox, PA-C 05/10/2017, 8:10 AM Pager: [phone]  Personally seen and examined. Agree with above.  Hip fracture, 83 year old female, confusion. Currently she is pleasant, able to answer that she is not having any chest discomfort or shortness of breath. She currently has mittens in place because she was trying  to pull at her IVs previously.  Physical exam-alert but has baseline confusion. 1/6 systolic murmur appreciated. Lungs are clear. Mittens in place.  Echo: 05/10/17  - Left ventricle: The cavity size was normal. Wall thickness was   increased in a pattern of mild LVH. Systolic function was   vigorous. The estimated ejection fraction was in the range of 65%   to 70%. Wall motion was normal; there were no regional wall   motion abnormalities. Doppler parameters are consistent with   abnormal left ventricular relaxation (grade 1 diastolic   dysfunction). Doppler parameters are consistent with high   ventricular filling pressure. - Aortic valve: There was trivial regurgitation. - Mitral valve: Calcified annulus. There was mild regurgitation. - Left atrium: The atrium was moderately dilated.  Impressions:  - Vigorous LV systolic function; mild LVH; mild diastolic   dysfunction with elevated LV filling pressure; sclerotic aortic   valve with trace AI; mild MR; moderate LAE.  Preoperative risk stratification prior to hip surgery  - She may proceed with surgery with  moderate overall cardiac risk based mainly upon age and minimally elevated troponin, flat, 0.13 - 0.18. This represents demand ischemia in the setting of her hip fracture. This does increase her overall risk over the next 60 days. I would not however pursue any  further cardiac testing prior to her operation. She is not complaining of any chest discomfort. I'm not quite sure why troponin was drawn in the first place as she did not clearly demonstrate any anginal symptoms. Reassuring echocardiogram with normal ejection fraction. No other high risk features. Ejection fraction normal.  - Murmur is likely secondary to vigorous outflow tract flow. Perhaps mild mitral regurgitation is playing a role as well. Overall no significant valvular abnormalities.  - I was unable to obtain a history from her of shortness of breath when going up stairs.  - Continue treat underlying hypertension.  We will sign off. Please let me know if we can be of further assistance.  Donato Schultz, MD

## 2017-05-10 NOTE — Care Management Note (Signed)
Case Management Note  Patient Details  Name: Angela [name] MRN: [id number] Date of Birth: 04/26/1933  Subjective/Objective: 83 y/o f admitted w/R hip fx. SX following-surgery today. PT cons post surgery. Likely SNF-CSW already following.                 Action/Plan:d/c SNF.   Expected Discharge Date:   (unknown)               Expected Discharge Plan:  Skilled Nursing Facility  In-House Referral:  Clinical Social Work  Discharge planning Services  CM Consult  Post Acute Care Choice:    Choice offered to:     DME Arranged:    DME Agency:     HH Arranged:    HH Agency:     Status of Service:  In process, will continue to follow  If discussed at Long Length of Stay Meetings, dates discussed:    Additional Comments:  Lanier ClamMahabir, Angela Basaldua, RN 05/10/2017, 11:33 AM

## 2017-05-10 NOTE — Anesthesia Postprocedure Evaluation (Signed)
Anesthesia Post Note  Patient: Cinda QuestAnn C Ismael  Procedure(s) Performed: Procedure(s) (LRB): ARTHROPLASTY  ANTERIOR APPROACH RIGHT HIP (Right)  Patient location during evaluation: PACU Anesthesia Type: General Level of consciousness: awake and alert Pain management: pain level controlled Vital Signs Assessment: post-procedure vital signs reviewed and stable Respiratory status: spontaneous breathing, nonlabored ventilation and respiratory function stable Cardiovascular status: blood pressure returned to baseline and stable Postop Assessment: no signs of nausea or vomiting Anesthetic complications: no       Last Vitals:  Vitals:   05/10/17 1714 05/10/17 1732  BP: (!) 178/82 (!) 166/71  Pulse: 76 74  Resp: 13 16  Temp: 36.5 C     Last Pain:  Vitals:   05/10/17 0455  TempSrc: Oral    LLE Motor Response: Purposeful movement (05/10/17 1725) LLE Sensation: Full sensation;No pain (05/10/17 1725) RLE Motor Response: Purposeful movement;Responds to commands (05/10/17 1725) RLE Sensation: Full sensation;No pain (05/10/17 1725) L Sensory Level: S1-Sole of foot, small toes (05/10/17 1725) R Sensory Level: S1-Sole of foot, small toes (05/10/17 1725)  Lowella CurbWarren Ray Meiya Wisler

## 2017-05-10 NOTE — Op Note (Signed)
OPERATIVE REPORT  SURGEON: Samson FredericBrian Kyri Dai, MD   ASSISTANT: Staff.  PREOPERATIVE DIAGNOSIS: Displaced Right femoral neck fracture.   POSTOPERATIVE DIAGNOSIS: Displaced Right femoral neck fracture.   PROCEDURE: Right total hip arthroplasty, anterior approach.   IMPLANTS: DePuy Tri Lock stem, size 4, hi offset. DePuy Pinnacle Cup, size 50 mm. DePuy Altrx liner, size 32 by 50 mm, +4 neutral. DePuy Biolox ceramic head ball, size 32 + 1 mm.  ANESTHESIA:  General  ESTIMATED BLOOD LOSS: 300 mL.  ANTIBIOTICS: 2 g Ancef.  DRAINS: None.  COMPLICATIONS: None.   CONDITION: PACU - hemodynamically stable.   BRIEF CLINICAL NOTE: Angela [name] is a 83 y.o. female with a displaced Right femoral neck fracture. After failing conservative management, the patient was indicated for total hip arthroplasty. The risks, benefits, and alternatives to the procedure were explained, and the patient elected to proceed.  PROCEDURE IN DETAIL: Surgical site was marked by myself in the pre-op holding area. Once inside the operating room, spinal anesthesia was obtained, and a foley catheter was inserted. The patient was then positioned on the Hana table. All bony prominences were well padded. The hip was prepped and draped in the normal sterile surgical fashion. A time-out was called verifying side and site of surgery. The patient received IV antibiotics within 60 minutes of beginning the procedure.  The direct anterior approach to the hip was performed through the Hueter interval. Lateral femoral circumflex vessels were treated with the Auqumantys. The anterior capsule was exposed and an inverted T capsulotomy was made.The femoral neck fracture was identified, and the hematoma was evacuated. The femoral neck cut was made to the level of the templated cut. A corkscrew was placed into the head and the head was removed. The femoral head was found to have eburnated bone. The head was passed to the back  table and was measured.  Acetabular exposure was achieved, and the pulvinar and labrum were excised. Sequental reaming of the acetabulum was then performed up to a size 49 mm reamer. A 50 mm cup was then opened and impacted into place at approximately 40 degrees of abduction and 20 degrees of anteversion. The final polyethylene liner was impacted into place and acetabular osteophytes were removed.   I then gained femoral exposure taking care to protect the abductors and greater trochanter. This was performed using standard external rotation, extension, and adduction. The capsule was peeled off the inner aspect of the greater trochanter, taking care to preserve the short external rotators. A cookie cutter was used to enter the femoral canal, and then the femoral canal finder was placed. Sequential broaching was performed up to a size 4. Calcar planer was used on the femoral neck remnant. I placed a hi offset neck and a trial head ball. The hip was reduced. Leg lengths and offset were checked fluoroscopically. The hip was dislocated and trial components were removed. The final implants were placed, and the hip was reduced.  Fluoroscopy was used to confirm component position and leg lengths. At 90 degrees of external rotation and full extension, the hip was stable to an anterior directed force.  The wound was copiously irrigated with normal saline using pulse lavage. Marcaine solution was injected into the periarticular soft tissue. The wound was closed in layers using #1 Vicryl and V-Loc for the fascia, 2-0 Vicryl for the subcutaneous fat, 2-0 Monocryl for the deep dermal layer, 3-0 running Monocryl subcuticular stitch, and Dermabond for the skin. Once the glue was fully dried, an Aquacell  Ag dressing was applied. The patient was transported to the recovery room in stable condition. Sponge, needle, and instrument counts were correct at the end of the case x2. The patient tolerated the  procedure well and there were no known complications.

## 2017-05-10 NOTE — Consult Note (Signed)
ORTHOPAEDIC CONSULTATION  REQUESTING PHYSICIAN: Glade Lloyd, MD  PCP:  Patient, No Pcp Per  Chief Complaint: Right hip injury  HPI: Angela [name] is a 83 y.o. female who complains of right hip pain after she tripped and fell at home 4 days ago. She was found down yesterday. She had inability to weight-bear secondary to right hip pain. She was brought to the emergency department at [facility], where workup revealed a displaced transcervical right femoral neck fracture. She was admitted to the hospitalist service. She was seen by cardiology due to noncompliance with antihypertensive medications, EKG changes, and elevated troponins. She was deemed not to be having acute coronary syndrome. She was declared medically stable for surgery.  Past Medical History:  Diagnosis Date  . Allergy   . Cataract   . Hypertension    Past Surgical History:  Procedure Laterality Date  . FRACTURE SURGERY    . HERNIA REPAIR     Social History   Social History  . Marital status: Divorced    Spouse name: N/A  . Number of children: N/A  . Years of education: N/A   Social History Main Topics  . Smoking status: Never Smoker  . Smokeless tobacco: Never Used  . Alcohol use No  . Drug use: No  . Sexual activity: Not Asked   Other Topics Concern  . None   Social History Narrative  . None   Family History  Problem Relation Age of Onset  . Stroke Mother   . Diabetes Sister   . CAD Neg Hx   . Hypertension Neg Hx   . Cancer Neg Hx    Allergies  Allergen Reactions  . Epinephrine Other (See Comments)  . Other Other (See Comments)    Allergic to almost all RX-high powered antibiotics, anesthea used at dentist Pt states she can't breath if she eats tomatoes. She states she is allergic to other vegetables but does not know the name of them.  . Phenylephrine Other (See Comments)    DO NO DILATE WITH PHENYLEPHRINE   Prior to Admission medications   Medication Sig Start Date  End Date Taking? Authorizing Provider  prednisoLONE acetate (PRED FORTE) 1 % ophthalmic suspension Place 1 drop into the right eye 4 (four) times daily.   Yes [provider]   Dg Chest 2 View  Result Date: 05/09/2017 CLINICAL DATA:  Fall, fell on Sunday evening and was found today in prone position with shortening and rotation of the RIGHT leg, RIGHT hip pain radiating to knee EXAM: CHEST  2 VIEW COMPARISON:  09/23/2010 FINDINGS: Normal heart size, mediastinal contours, and pulmonary vascularity. Scattered atelectasis versus infiltrate in LEFT lung. Unable to exclude LEFT perihilar mass with this appearance. Tiny LEFT pleural effusion blunts the lateral costophrenic angle. Remaining lungs clear. No pneumothorax. Atherosclerotic calcification aorta. Bones demineralized. IMPRESSION: Scattered infiltrate versus atelectasis in LEFT lung, unable to exclude LEFT perihilar mass; when the patient's clinical condition permits, recommend follow-up CT chest with contrast to exclude neoplasm. Electronically Signed   By: Ulyses Southward M.D.   On: 05/09/2017 17:47   Ct Angio Neck W And/or Wo Contrast  Result Date: 05/09/2017 CLINICAL DATA:  Patient found down and side her house after multiple days. Recent fall EXAM: CT ANGIOGRAPHY NECK TECHNIQUE: Multidetector CT imaging of the neck was performed using the standard protocol during bolus administration of intravenous contrast. Multiplanar CT image reconstructions and MIPs were obtained to evaluate the vascular anatomy. Carotid stenosis measurements (  when applicable) are obtained utilizing NASCET criteria, using the distal internal carotid diameter as the denominator. CONTRAST:  100 mL Isovue 370 COMPARISON:  None. FINDINGS: Aortic arch: There is atherosclerotic calcification of the aortic arch without stenosis. There is a normal 3 vessel branching pattern. Both subclavian arteries are widely patent. Right carotid system: The right common carotid artery is normal.  The right internal carotid artery is normal without stenosis. Left carotid system: The left common carotid artery and internal carotid artery are normal. Vertebral arteries: Vertebral arteries are mildly right-dominant. Both vertebral artery origins are normal. The remainder of the vertebral arteries are normal to their confluence at the basilar artery. Skeleton: Multilevel facet hypertrophy but no bony spinal canal stenosis. No lytic or blastic lesions. Other neck: Thyroid is normal. No cervical lymphadenopathy. Salivary glands are normal. No focal abnormality of the pharynx or larynx. Upper chest: Please see dedicated report for CTA of the chest. IMPRESSION: 1. No aneurysm, dissection or hemodynamically significant stenosis of the carotid or vertebral arteries. 2. Aortic atherosclerosis. 3. Please see dedicated report for CTA of the chest for findings below the thoracic inlet. Electronically Signed   By: Deatra RobinsonKevin  Herman M.D.   On: 05/09/2017 21:22   Ct Angio Chest Pe W Or Wo Contrast  Result Date: 05/09/2017 CLINICAL DATA:  Fall, abnormal chest x-ray EXAM: CT ANGIOGRAPHY CHEST WITH CONTRAST TECHNIQUE: Multidetector CT imaging of the chest was performed using the standard protocol during bolus administration of intravenous contrast. Multiplanar CT image reconstructions and MIPs were obtained to evaluate the vascular anatomy. CONTRAST:  100 mL Isovue 370 intravenous COMPARISON:  Radiograph 05/09/2017 FINDINGS: Cardiovascular: Satisfactory opacification of the pulmonary arteries to the segmental level. No evidence of pulmonary embolism. Non aneurysmal aorta. No dissection. Aortic atherosclerosis. Mild coronary artery calcification. Borderline heart size. No pericardial effusion. Mediastinum/Nodes: No enlarged mediastinal, hilar, or axillary lymph nodes. Thyroid gland, trachea, and esophagus demonstrate no significant findings. Lungs/Pleura: Mild consolidation in the lingula. No pleural effusion or pneumothorax.  Upper Abdomen: Partially visualized 2.5 cm low-density lesion in the left upper quadrant. Musculoskeletal: No chest wall abnormality. No acute or significant osseous findings. Review of the MIP images confirms the above findings. IMPRESSION: 1. Negative for acute pulmonary embolus or aortic dissection. No evidence for left hilar mass 2. Partially visualized 2.5 cm low-attenuation lesion left upper quadrant possibly a cyst from the kidney. Electronically Signed   By: Jasmine PangKim  Fujinaga M.D.   On: 05/09/2017 21:20   Dg Knee Complete 4 Views Right  Result Date: 05/09/2017 CLINICAL DATA:  Fall, fell on Sunday evening and was found today in prone position with shortening and rotation of the RIGHT leg, RIGHT hip pain radiating to knee EXAM: RIGHT KNEE - COMPLETE 4+ VIEW COMPARISON:  None FINDINGS: Diffuse osseous demineralization. Medial compartment joint space narrowing spur formation. Mild chondrocalcinosis in medial compartment question CPPD. No acute fracture, dislocation, or bone destruction. No knee joint effusion. IMPRESSION: Osseous demineralization with mild degenerative changes and question CPPD RIGHT knee. No acute abnormalities. Please refer to report of RIGHT hip radiograph exam. Electronically Signed   By: Ulyses SouthwardMark  Boles M.D.   On: 05/09/2017 17:45   Dg Hip Unilat W Or Wo Pelvis 2-3 Views Right  Result Date: 05/09/2017 CLINICAL DATA:  Fall EXAM: DG HIP (WITH OR WITHOUT PELVIS) 2-3V RIGHT COMPARISON:  None. FINDINGS: Old appearing right inferior pubic ramus fracture. Pubic symphysis is intact. No right femoral head dislocation. Foreshortened appearance of the right femoral neck with suspected transcervical fracture. IMPRESSION:  1. Foreshortened appearance of the right femoral neck with suspected transcervical fracture. 2. Old appearing right inferior pubic ramus fracture Electronically Signed   By: Jasmine Pang M.D.   On: 05/09/2017 17:45   Dg Femur, Min 2 Views Right  Result Date: 05/09/2017 CLINICAL  DATA:  Fall, fell on Sunday evening and was found today in prone position with shortening and rotation of the RIGHT leg, RIGHT hip pain radiating to knee EXAM: RIGHT FEMUR 2 VIEWS COMPARISON:  None FINDINGS: Osseous mineralization. RIGHT hip imaged and reported separately. Remainder of RIGHT femur appears intact without fracture or dislocation. Knee joint alignment grossly normal. IMPRESSION: Osseous demineralization without acute abnormalities. Please refer to report of RIGHT hip radiographs. Electronically Signed   By: Ulyses Southward M.D.   On: 05/09/2017 17:44    Positive ROS: All other systems have been reviewed and were otherwise negative with the exception of those mentioned in the HPI and as above.  Physical Exam: General: Alert, no acute distress Cardiovascular: No pedal edema Respiratory: No cyanosis, no use of accessory musculature GI: No organomegaly, abdomen is soft and non-tender Skin: No lesions in the area of chief complaint Neurologic: Sensation intact distally Psychiatric: Awake and alert Lymphatic: No axillary or cervical lymphadenopathy  MUSCULOSKELETAL: Examination of bilateral upper extremities reveals no skin wounds or lesions. She has full painless range of motion. Neurovascularly intact.  Examination of the left lower extremity reveals no skin wounds or lesions. No tenderness to palpation. No pain with range of motion.  Examination of the right lower extremity reveals superficial abrasions over the knee. She is shortened and externally rotated. She has pain with attempted range of motion of the hip. She does have positive motor function dorsiflexion, plantarflexion, great toe extension. She has palpable pedal pulses. She reports intact sensation to light touch.  Assessment: Displaced right femoral neck fracture Multiple medical problems with noncompliance  Plan: I discussed the findings with the patient and her son, Angela [name], over the telephone. In order to allow for pain  control and early mobilization out of bed, I recommended operative intervention. Per the patient's son, she ambulates without any assistive devices and lives completely independently. She does all of her own shopping. Given her activity level, she would be best served by a total hip arthroplasty. We did discuss the risks, benefits, and alternatives. Please see statement of risk. We will plan for surgery today.  The risks, benefits, and alternatives were discussed with the patient and family. There are risks associated with the surgery including, but not limited to, problems with anesthesia (death), infection, instability (giving out of the joint), dislocation, differences in leg length/angulation/rotation, fracture of bones, loosening or failure of implants, hematoma (blood accumulation) which may require surgical drainage, blood clots, pulmonary embolism, nerve injury (foot drop and lateral thigh numbness), and blood vessel injury. The patient and family understand these risks and elect to proceed.    Mohid Furuya, Cloyde Reams, MD Cell [phone]    05/10/2017 1:21 PM

## 2017-05-10 NOTE — Progress Notes (Signed)
  Echocardiogram 2D Echocardiogram has been performed.  Arvil ChacoFoster, Eldean Klatt 05/10/2017, 11:13 AM

## 2017-05-11 ENCOUNTER — Encounter (HOSPITAL_COMMUNITY): Payer: Self-pay | Admitting: Orthopedic Surgery

## 2017-05-11 LAB — GLUCOSE, CAPILLARY
GLUCOSE-CAPILLARY: 170 mg/dL — AB (ref 65–99)
GLUCOSE-CAPILLARY: 206 mg/dL — AB (ref 65–99)
Glucose-Capillary: 164 mg/dL — ABNORMAL HIGH (ref 65–99)
Glucose-Capillary: 165 mg/dL — ABNORMAL HIGH (ref 65–99)
Glucose-Capillary: 168 mg/dL — ABNORMAL HIGH (ref 65–99)
Glucose-Capillary: 179 mg/dL — ABNORMAL HIGH (ref 65–99)
Glucose-Capillary: 221 mg/dL — ABNORMAL HIGH (ref 65–99)

## 2017-05-11 LAB — CBC WITH DIFFERENTIAL/PLATELET
BASOS ABS: 0 10*3/uL (ref 0.0–0.1)
BASOS PCT: 0 %
EOS PCT: 3 %
Eosinophils Absolute: 0.4 10*3/uL (ref 0.0–0.7)
HCT: 33.2 % — ABNORMAL LOW (ref 36.0–46.0)
Hemoglobin: 10.8 g/dL — ABNORMAL LOW (ref 12.0–15.0)
LYMPHS PCT: 12 %
Lymphs Abs: 1.5 10*3/uL (ref 0.7–4.0)
MCH: 29.8 pg (ref 26.0–34.0)
MCHC: 32.5 g/dL (ref 30.0–36.0)
MCV: 91.7 fL (ref 78.0–100.0)
Monocytes Absolute: 1 10*3/uL (ref 0.1–1.0)
Monocytes Relative: 9 %
Neutro Abs: 8.8 10*3/uL — ABNORMAL HIGH (ref 1.7–7.7)
Neutrophils Relative %: 75 %
Platelets: 272 10*3/uL (ref 150–400)
RBC: 3.62 MIL/uL — AB (ref 3.87–5.11)
RDW: 12.5 % (ref 11.5–15.5)
WBC: 11.7 10*3/uL — AB (ref 4.0–10.5)

## 2017-05-11 LAB — BASIC METABOLIC PANEL
ANION GAP: 8 (ref 5–15)
BUN: 24 mg/dL — ABNORMAL HIGH (ref 6–20)
CALCIUM: 8.3 mg/dL — AB (ref 8.9–10.3)
CO2: 25 mmol/L (ref 22–32)
Chloride: 108 mmol/L (ref 101–111)
Creatinine, Ser: 0.56 mg/dL (ref 0.44–1.00)
Glucose, Bld: 168 mg/dL — ABNORMAL HIGH (ref 65–99)
Potassium: 3.5 mmol/L (ref 3.5–5.1)
SODIUM: 141 mmol/L (ref 135–145)

## 2017-05-11 LAB — VITAMIN D 25 HYDROXY (VIT D DEFICIENCY, FRACTURES): Vit D, 25-Hydroxy: 32.8 ng/mL (ref 30.0–100.0)

## 2017-05-11 LAB — MAGNESIUM: MAGNESIUM: 2 mg/dL (ref 1.7–2.4)

## 2017-05-11 LAB — T3: T3, Total: 73 ng/dL (ref 71–180)

## 2017-05-11 MED ORDER — LIVING WELL WITH DIABETES BOOK
Freq: Once | Status: AC
  Administered 2017-05-11: 10:00:00
  Filled 2017-05-11: qty 1

## 2017-05-11 MED ORDER — ADULT MULTIVITAMIN W/MINERALS CH
1.0000 | ORAL_TABLET | Freq: Every day | ORAL | Status: DC
  Administered 2017-05-12 – 2017-05-13 (×2): 1 via ORAL
  Filled 2017-05-11 (×2): qty 1

## 2017-05-11 MED ORDER — ASPIRIN 81 MG PO TBEC: 81.0000 mg | DELAYED_RELEASE_TABLET | Freq: Two times a day (BID) | ORAL | 1 refills | Status: AC

## 2017-05-11 MED ORDER — AMLODIPINE BESYLATE 5 MG PO TABS
5.0000 mg | ORAL_TABLET | Freq: Every day | ORAL | Status: DC
  Administered 2017-05-12 – 2017-05-13 (×2): 5 mg via ORAL
  Filled 2017-05-11 (×2): qty 1

## 2017-05-11 MED ORDER — HYDROCODONE-ACETAMINOPHEN 5-325 MG PO TABS: 1.0000 | ORAL_TABLET | ORAL | 0 refills | Status: DC | PRN

## 2017-05-11 NOTE — NC FL2 (Signed)
[place] MEDICAID FL2 LEVEL OF CARE SCREENING TOOL     IDENTIFICATION  Patient Name: Angela [name] Birthdate: Jun 19, 1933 Sex: female Admission Date (Current Location): 05/09/2017  [facility] [place]   Facility and Address:  [facility],  [address], [place] [id number]      Provider Number: [id number]  Attending Physician Name and Address:  Glade LloydAlekh, Kshitiz, MD  Relative Name and Phone Number:       Current Level of Care: Hospital Recommended Level of Care: Skilled Nursing Facility Prior Approval Number:    Date Approved/Denied:   PASRR Number: [id number]  Discharge Plan: SNF    Current Diagnoses: Patient Active Problem List   Diagnosis Date Noted  . Closed displaced fracture of right femoral neck (HCC) 05/10/2017  . Fracture of femoral neck, right (HCC) 05/09/2017  . Essential hypertension 05/09/2017  . Hip fracture (HCC) 05/09/2017  . Heart murmur 05/09/2017  . Abnormal CXR 05/09/2017  . Pulsatile neck mass 05/09/2017  . Hyperglycemia 05/09/2017  . Elevated troponin 05/09/2017  . Leukocytosis 05/09/2017  . CAP (community acquired pneumonia) 05/09/2017  . Elevated TSH 05/09/2017    Orientation RESPIRATION BLADDER Height & Weight     Self, Place, Situation  O2 Continent Weight: 146 lb 13.2 oz (66.6 kg) Height:  5\' 2"  (157.5 cm)  BEHAVIORAL SYMPTOMS/MOOD NEUROLOGICAL BOWEL NUTRITION STATUS  Other (Comment) (no behaviors)   Continent Diet (see dc summary)  AMBULATORY STATUS COMMUNICATION OF NEEDS Skin   Limited Assist Verbally Surgical wounds                       Personal Care Assistance Level of Assistance  Bathing, Feeding, Dressing Bathing Assistance: Limited assistance Feeding assistance: Independent Dressing Assistance: Limited assistance     Functional Limitations Info  Sight, Hearing, Speech Sight Info: Adequate Hearing Info: Adequate Speech Info: Adequate    SPECIAL CARE FACTORS FREQUENCY  PT (By  licensed PT), OT (By licensed OT)     PT Frequency: 5x wk OT Frequency: 5x wk            Contractures Contractures Info: Not present    Additional Factors Info  Code Status Code Status Info: Full Code             Current Medications (05/11/2017):  This is the current hospital active medication list Current Facility-Administered Medications  Medication Dose Route Frequency Provider Last Rate Last Dose  . 0.9 %  sodium chloride infusion   Intravenous Continuous Glade LloydAlekh, Kshitiz, MD 75 mL/hr at 05/11/17 0603    . acetaminophen (TYLENOL) tablet 650 mg  650 mg Oral Q6H PRN Therisa Doyneoutova, Anastassia, MD       Or  . acetaminophen (TYLENOL) suppository 650 mg  650 mg Rectal Q6H PRN Doutova, Anastassia, MD      . amLODipine (NORVASC) tablet 2.5 mg  2.5 mg Oral Daily Iran OuchStrader, GrenadaBrittany M, PA-C   2.5 mg at 05/11/17 0836  . aspirin EC tablet 81 mg  81 mg Oral BID WC Samson FredericSwinteck, Brian, MD   81 mg at 05/11/17 0836  . atorvastatin (LIPITOR) tablet 20 mg  20 mg Oral q1800 Alekh, Kshitiz, MD      . azithromycin (ZITHROMAX) 500 mg in dextrose 5 % 250 mL IVPB  500 mg Intravenous Q24H Therisa Doyneoutova, Anastassia, MD   Stopped at 05/10/17 2347  . cefTRIAXone (ROCEPHIN) 1 g in dextrose 5 % 50 mL IVPB  1 g Intravenous Q24H Therisa Doyneoutova, Anastassia, MD  Stopped at 05/10/17 2234  . chlorhexidine (PERIDEX) 0.12 % solution 15 mL  15 mL Mouth Rinse BID Therisa Doyne, MD   15 mL at 05/10/17 2128  . feeding supplement (ENSURE ENLIVE) (ENSURE ENLIVE) liquid 237 mL  237 mL Oral BID BM Doutova, Anastassia, MD      . guaiFENesin (MUCINEX) 12 hr tablet 600 mg  600 mg Oral BID Therisa Doyne, MD   600 mg at 05/10/17 2128  . HYDROcodone-acetaminophen (NORCO/VICODIN) 5-325 MG per tablet 1-2 tablet  1-2 tablet Oral Q4H PRN Doutova, Anastassia, MD      . insulin aspart (novoLOG) injection 0-9 Units  0-9 Units Subcutaneous Q4H Therisa Doyne, MD   2 Units at 05/11/17 0836  . levothyroxine (SYNTHROID, LEVOTHROID) tablet 50 mcg   50 mcg Oral QAC breakfast Glade Lloyd, MD   50 mcg at 05/11/17 0836  . living well with diabetes book MISC   Does not apply Once Glade Lloyd, MD      . MEDLINE mouth rinse  15 mL Mouth Rinse q12n4p Doutova, Anastassia, MD      . menthol-cetylpyridinium (CEPACOL) lozenge 3 mg  1 lozenge Oral PRN Swinteck, Arlys John, MD       Or  . phenol (CHLORASEPTIC) mouth spray 1 spray  1 spray Mouth/Throat PRN Swinteck, Arlys John, MD      . methocarbamol (ROBAXIN) tablet 500 mg  500 mg Oral Q6H PRN Doutova, Anastassia, MD       Or  . methocarbamol (ROBAXIN) 500 mg in dextrose 5 % 50 mL IVPB  500 mg Intravenous Q6H PRN Therisa Doyne, MD   Stopped at 05/10/17 1702  . metoCLOPramide (REGLAN) tablet 5-10 mg  5-10 mg Oral Q8H PRN Swinteck, Arlys John, MD       Or  . metoCLOPramide (REGLAN) injection 5-10 mg  5-10 mg Intravenous Q8H PRN Swinteck, Arlys John, MD      . morphine 4 MG/ML injection 0.52 mg  0.52 mg Intravenous Q2H PRN Doutova, Anastassia, MD      . ondansetron (ZOFRAN) tablet 4 mg  4 mg Oral Q6H PRN Doutova, Anastassia, MD       Or  . ondansetron (ZOFRAN) injection 4 mg  4 mg Intravenous Q6H PRN Doutova, Anastassia, MD      . polyethylene glycol (MIRALAX / GLYCOLAX) packet 17 g  17 g Oral Daily PRN Swinteck, Arlys John, MD      . prednisoLONE acetate (PRED FORTE) 1 % ophthalmic suspension 1 drop  1 drop Right Eye QID Therisa Doyne, MD   1 drop at 05/10/17 2129     Discharge Medications: Please see discharge summary for a list of discharge medications.  Relevant Imaging Results:  Relevant Lab Results:   Additional Information SS # [id number]  Taliesin Hartlage, Dickey Gave, LCSW

## 2017-05-11 NOTE — Progress Notes (Signed)
Inpatient Diabetes Program Recommendations  AACE/ADA: New Consensus Statement on Inpatient Glycemic Control (2015)  Target Ranges:  Prepandial:   less than 140 mg/dL      Peak postprandial:   less than 180 mg/dL (1-2 hours)      Critically ill patients:  140 - 180 mg/dL   Results for Angela [name], Angela [name] (MRN [id number]) as of 05/11/2017 09:11  Ref. Range 05/09/2017 15:53  Hemoglobin A1C Latest Ref Range: 4.8 - 5.6 % 7.0 (H)   Results for Angela [name], Angela [name] (MRN [id number]) as of 05/11/2017 09:11  Ref. Range 05/09/2017 23:16 05/10/2017 00:23 05/10/2017 04:51 05/10/2017 07:35 05/10/2017 11:38 05/10/2017 13:22 05/10/2017 16:06 05/10/2017 17:26 05/10/2017 21:20 05/11/2017 00:36 05/11/2017 05:52 05/11/2017 08:23  Glucose-Capillary Latest Ref Range: 65 - 99 mg/dL 782337 (H) 956430 (H) 213178 (H) 122 (H) 210 (H) 210 (H) 206 (H) 229 (H) 206 (H) 206 (H) 170 (H) 168 (H)    Admit with: Hip Fracture  History: NO History of DM noted in H&P  Current Insulin Orders: Novolog Sensitive Correction Scale/ SSI (0-9 units) Q4 hours      MD- Do not see that patient has a prior History of DM.  Is this a new diagnosis?  Current Hemoglobin A1c of 7% and Hyperglycemia on admission point to positive diagnosis of DM per ADA Standards of Care.  If this is a new diagnosis of DM, please make sure to address diagnosis with patient and add "Diabetes" to Active Problem List on patient's chart.  Plan to speak with patient about her blood sugars.    --Will follow patient during hospitalization--  Ambrose FinlandJeannine Johnston Jodell Weitman RN, MSN, CDE Diabetes Coordinator Inpatient Glycemic Control Team Team Pager: [phone] (8a-5p)

## 2017-05-11 NOTE — Evaluation (Signed)
Clinical/Bedside Swallow Evaluation Patient Details  Name: Angela [name] C Ikner MRN: [id number] Date of Birth: 08-10-1933  Today's Date: 05/11/2017 Time: SLP Start Time (ACUTE ONLY): 1519 SLP Stop Time (ACUTE ONLY): 1529 SLP Time Calculation (min) (ACUTE ONLY): 10 min  Past Medical History:  Past Medical History:  Diagnosis Date  . Allergy   . Cataract   . Hypertension    Past Surgical History:  Past Surgical History:  Procedure Laterality Date  . FRACTURE SURGERY    . HERNIA REPAIR    . HIP ARTHROPLASTY Right 05/10/2017   Procedure: ARTHROPLASTY  ANTERIOR APPROACH RIGHT HIP;  Surgeon: Samson FredericSwinteck, Brian, MD;  Location: [facility] ORS;  Service: Orthopedics;  Laterality: Right;   HPI:  Angela [name] 83 y.o.femalewith medical history significant of hypertesionpresented with [name] mechanical fall 3 days ago was not able to get up.Underwent surgical repair 5/24. Initial CXR 5/23 Scattered infiltrate versus atelectasis in LEFT lung, unable to exclude LEFT perihilar mass. Chest CT Negative for acute pulmonary embolus or aortic dissection. No evidence for left hilar mass.    Assessment / Plan / Recommendation Clinical Impression  Pt unable to consume water consecutively during the 3 oz water test. Cups and straw sips were tolerated with insignificant subtle throat clears. No coughing or wet vocal quality. Pt denies history of dysphagia. Risk appears low. Continue regular texture and thin liquids, straws allowed and pills with water. No f/u needed.  SLP Visit Diagnosis: Dysphagia, unspecified (R13.10)    Aspiration Risk  Mild aspiration risk    Diet Recommendation Regular;Thin liquid   Liquid Administration via: Cup;Straw Medication Administration: Whole meds with liquid Supervision: Patient able to self feed Postural Changes: Seated upright at 90 degrees    Other  Recommendations Oral Care Recommendations: Oral care BID   Follow up Recommendations None      Frequency and Duration           Prognosis        Swallow Study   General HPI: Angela [name] 83 y.o.femalewith medical history significant of hypertesionpresented with [name] mechanical fall 3 days ago was not able to get up.Underwent surgical repair 5/24. Initial CXR 5/23 Scattered infiltrate versus atelectasis in LEFT lung, unable to exclude LEFT perihilar mass. Chest CT Negative for acute pulmonary embolus or aortic dissection. No evidence for left hilar mass.  Type of Study: Bedside Swallow Evaluation Previous Swallow Assessment: none Diet Prior to this Study: Regular;Thin liquids Temperature Spikes Noted: No Respiratory Status: Room air History of Recent Intubation: Yes Length of Intubations (days):  (during surgery) Date extubated: 05/10/17 Behavior/Cognition: Alert;Cooperative;Pleasant mood;Requires cueing Oral Cavity Assessment: Within Functional Limits Oral Care Completed by SLP: No Oral Cavity - Dentition: Adequate natural dentition Vision: Functional for self-feeding Self-Feeding Abilities: Able to feed self Patient Positioning: Upright in bed Baseline Vocal Quality: Normal Volitional Cough: Strong Volitional Swallow: Able to elicit    Oral/Motor/Sensory Function Overall Oral Motor/Sensory Function: Within functional limits   Ice Chips Ice chips: Not tested   Thin Liquid Thin Liquid: Within functional limits Presentation: Cup;Straw    Nectar Thick Nectar Thick Liquid: Not tested   Honey Thick Honey Thick Liquid: Not tested   Puree Puree: Within functional limits   Solid   GO   Solid: Within functional limits        Royce MacadamiaLitaker, Hriday Stai Willis 05/11/2017,3:41 PM  Breck CoonsLisa Willis MaconLitaker M.Ed [employer]-SLP Pager [phone]

## 2017-05-11 NOTE — Progress Notes (Signed)
Spoke with pt and her son about pt's Hyperglycemia on admission.  Explained to them that pt's Current A1c is 7% which could be indicative of a diagnosis of DM (A1c>6.5% per ADA Standards of Care).  Explained to pt and son that MD will need to formally diagnosis patient with DM if indeed he believes she has DM.  Explained why we are checking her CBGs so often and why we are giving insulin.    Spoke with pt and her son about what diabetes is.  Discussed A1C results with them and explained what an A1C is, basic pathophysiology of DM Type 2, basic home care, basic diabetes diet nutrition principles, importance of checking CBGs and maintaining good CBG control to prevent long-term and short-term complications.  Also reviewed blood sugar goals and A1c goals for home.    RNs to provide ongoing basic DM education at bedside with this patient.  Have ordered educational booklet and DM videos.  Have also placed RD consult for DM diet education for this patient.    --Will follow patient during hospitalization--  Ambrose FinlandJeannine Johnston Orvan Papadakis RN, MSN, CDE Diabetes Coordinator Inpatient Glycemic Control Team Team Pager: [phone] (8a-5p)

## 2017-05-11 NOTE — Care Management Note (Signed)
Case Management Note  Patient Details  Name: Cinda Questnn C Krusemark MRN: [id number] Date of Birth: March 22, 1933  Subjective/Objective: Noted PT recc SNF-CSW already following.                   Action/Plan:d/c plan SNF.   Expected Discharge Date:   (unknown)               Expected Discharge Plan:  Skilled Nursing Facility  In-House Referral:  Clinical Social Work  Discharge planning Services  CM Consult  Post Acute Care Choice:    Choice offered to:     DME Arranged:    DME Agency:     HH Arranged:    HH Agency:     Status of Service:  In process, will continue to follow  If discussed at Long Length of Stay Meetings, dates discussed:    Additional Comments:  Lanier ClamMahabir, Carlus Stay, RN 05/11/2017, 12:29 PM

## 2017-05-11 NOTE — Progress Notes (Signed)
Pt. Brother, Ocie Cornfieldhomas Eakins, called at front desk to speak with RN to get an update on pt. And to have his daughter, who is an Charity fundraiserN, listen in. This RN attempted to give Maisie Fushomas a call back on the phone number left [phone] to inform him that he was not on the list to disclose information and that this RN asked pt's permission to speak with him. Pt. Stated she would rather speak to him herself. No answer on Thomas's phone. Pt. Made aware.

## 2017-05-11 NOTE — Progress Notes (Addendum)
Physical Therapy Treatment Patient Details Name: Angela [name] MRN: [id number] DOB: February 26, 1933 Today's Date: 05/11/2017    History of Present Illness 83 yo female admitted with a R femoral neck fracture. S/P R THA-direct anterior 05/10/17.     PT Comments    Progressing slowly with mobility. Pt declined OOB with PT this session due to fatigue. She was very drowsy this session and had trouble participating while therapist was assisting with ROM exercises. Continue to recommend SNF.     Follow Up Recommendations  SNF     Equipment Recommendations  Rolling walker with 5" wheels;3in1 (PT)    Recommendations for Other Services       Precautions / Restrictions Precautions Precautions: Fall Restrictions Weight Bearing Restrictions: No RLE Weight Bearing: Weight bearing as tolerated    Mobility  Bed Mobility Overal bed mobility: Needs Assistance   General bed mobility comments: NT-pt declined 2* fatigue  Transfers         Ambulation/Gait   Stairs            Wheelchair Mobility    Modified Rankin (Stroke Patients Only)       Balance Overall balance assessment: Needs assistance         Standing balance support: Bilateral upper extremity supported Standing balance-Leahy Scale: Poor                              Cognition Arousal/Alertness: Awake/alert (very drowsy) Behavior During Therapy: WFL for tasks assessed/performed Overall Cognitive Status: Difficult to assess Area of Impairment: Following commands;Safety/judgement;Problem solving                       Following Commands: Follows one step commands consistently (with multimodal cues) Safety/Judgement: Decreased awareness of safety     Exercises Total Joint Exercises Ankle Circles/Pumps: Both;AROM;AAROM;10 reps Heel Slides: AAROM;Right;10 reps;Supine Hip ABduction/ADduction: AAROM;Right;10 reps;Supine    General Comments        Pertinent Vitals/Pain Pain  Assessment: Faces Faces Pain Scale: Hurts little more Pain Location: R hip with activity Pain Descriptors / Indicators: Sore Pain Intervention(s): Monitored during session    Home Living Family/patient expects to be discharged to:: Skilled nursing facility Living Arrangements: Alone Available Help at Discharge: Family (son is in town for 5 days) Type of Home: House Home Access: Stairs to enter Entrance Stairs-Rails: Right Home Layout: Laundry or work area in basement;One level (office in basement) Home Equipment: None Additional Comments: son here for 5 days    Prior Function Level of Independence: Independent          PT Goals (current goals can now be found in the care plan section) Acute Rehab PT Goals Patient Stated Goal: less pain. regain PLOF.  PT Goal Formulation: With patient/family Time For Goal Achievement: 05/25/17 Potential to Achieve Goals: Good Progress towards PT goals: Progressing toward goals (very slowly)    Frequency    Min 5X/week      PT Plan Current plan remains appropriate    Co-evaluation              AM-PAC PT "6 Clicks" Daily Activity  Outcome Measure  Difficulty turning over in bed (including adjusting bedclothes, sheets and blankets)?: A Lot Difficulty moving from lying on back to sitting on the side of the bed? : A Lot Difficulty sitting down on and standing up from a chair with arms (e.g., wheelchair, bedside commode, etc,.)?: A Little Help  needed moving to and from a bed to chair (including a wheelchair)?: A Little Help needed walking in hospital room?: A Little Help needed climbing 3-5 steps with a railing? : A Lot 6 Click Score: 15    End of Session Equipment Utilized During Treatment: Gait belt Activity Tolerance: Patient limited by fatigue Patient left: with call bell/phone within reach;with family/visitor present;in bed;with bed alarm set   PT Visit Diagnosis: Muscle weakness (generalized) (M62.81);Difficulty in  walking, not elsewhere classified (R26.2)     Time: 4098-1191 PT Time Calculation (min) (ACUTE ONLY): 13 min  Charges:  $Therapeutic Exercise: 8-22 mins                    G Codes:          Rebeca Alert, MPT Pager: [phone]

## 2017-05-11 NOTE — Clinical Social Work Placement (Signed)
   CLINICAL SOCIAL WORK PLACEMENT  NOTE  Date:  05/11/2017  Patient Details  Name: Angela [name] MRN: [id number] Date of Birth: 1933/02/15  Clinical Social Work is seeking post-discharge placement for this patient at the Skilled  Nursing Facility level of care (*CSW will initial, date and re-position this form in  chart as items are completed):  Yes   Patient/family provided with [facility] Clinical Social Work Department's list of facilities offering this level of care within the geographic area requested by the patient (or if unable, by the patient's family).  Yes   Patient/family informed of their freedom to choose among providers that offer the needed level of care, that participate in Medicare, Medicaid or managed care program needed by the patient, have an available bed and are willing to accept the patient.  Yes   Patient/family informed of [facility]'s ownership interest [facility] and [facility], as well as of the fact that they are under no obligation to receive care at these facilities.  PASRR submitted to EDS on 05/11/17     PASRR number received on 05/11/17     Existing PASRR number confirmed on       FL2 transmitted to all facilities in geographic area requested by pt/family on 05/11/17     FL2 transmitted to all facilities within larger geographic area on       Patient informed that his/her managed care company has contracts with or will negotiate with certain facilities, including the following:        Yes   Patient/family informed of bed offers received.  Patient chooses bed at       Physician recommends and patient chooses bed at      Patient to be transferred to   on  .  Patient to be transferred to facility by       Patient family notified on   of transfer.  Name of family member notified:        PHYSICIAN       Additional Comment:    _______________________________________________ Royetta AsalHaidinger, Alianys Chacko Lee, LCSW  [phone] 05/11/2017,  3:50 PM

## 2017-05-11 NOTE — Progress Notes (Addendum)
Initial Nutrition Assessment  DOCUMENTATION CODES:   Not applicable  INTERVENTION:   Ensure Enlive po BID, each supplement provides 350 kcal and 20 grams of protein  MVI  NUTRITION DIAGNOSIS:   Increased nutrient needs related to other (see comment) (hip fracture) as evidenced by increased estimated needs from protein.  GOAL:   Patient will meet greater than or equal to 90% of their needs  MONITOR:   PO intake, Supplement acceptance, Labs, Weight trends  REASON FOR ASSESSMENT:   Consult Assessment of nutrition requirement/status  ASSESSMENT:   83 y.o. female with medical history significant of hypertesion  presented with a mechanical fall 3 days ago and was not able to get up. She was found to have right femoral neck fracture and slightly positive troponins. Pt also noted to have CAP   Met with pt in room today. Pt is a poor historian but pt's son in room reports that pt was eating well up until she fell but has not eaten much in the past three days. Pt ate 25% of her clear liquid diet today; she reports good appetite but doesn't like the liquid diet. Pt advanced to full liquid diet today. Pt ordered for Ensure and RD encouraged pt to drink supplements. Pt is unsure if she has lost any weight; no recent weight history for this pt in chart. Pt had hip surgery 5/24. Pt with hyperglycemia on admission. No h/o diabetes. Pt will need diabetes education prior to d/c; RD feels pt is not appropriate for education today as she seems confused and is on liquid diet. Son at bedside lives in [place]; pt to possibly discharge to SNF.   Medications reviewed and include: aspirin, insulin, synthroid, azithromycin, ceftriaxone  Labs reviewed: BUN 24(H), Ca 8.3(L) WBC- 11.7(H) cbgs- 271, 349, 208, 168 x 48 hrs AIC 7.0(H)  Nutrition-Focused physical exam completed. Findings are no fat depletion, moderate muscle depletion in hands, and no edema.   Diet Order:  Diet full liquid Room service  appropriate? Yes; Fluid consistency: Thin  Skin:  Wound (see comment) (hip incision )  Last BM:  5/21  Height:   Ht Readings from Last 1 Encounters:  05/09/17 5' 2"  (1.575 m)    Weight:   Wt Readings from Last 1 Encounters:  05/09/17 146 lb 13.2 oz (66.6 kg)    Ideal Body Weight:  50 kg  BMI:  Body mass index is 26.85 kg/m.  Estimated Nutritional Needs:   Kcal:  1400-1600kcal/day   Protein:  73-86g/day   Fluid:  >1.4L/day  EDUCATION NEEDS:   No education needs identified at this time  Koleen Distance MS, RD, LDN Pager #- [phone]

## 2017-05-11 NOTE — Progress Notes (Signed)
Patient ID: Angela [name], female   DOB: 02-24-1933, 83 y.o.   MRN: [id number]  PROGRESS NOTE    Angela [name]  XLK:[id number] DOB: February 20, 1933 DOA: 05/09/2017 PCP: Patient, No Pcp Per   Brief Narrative:  83 y.o.femalewith medical history significant of hypertesion presented with a mechanical fall 3 days ago and was not able to get up. She was found to have right femoral neck fracture and slightly positive troponins. Orthopedics and cardiology were consulted. Patient had hip surgery done on 05/10/2017.  Assessment & Plan:   Active Problems:   Fracture of femoral neck, right (HCC)   Essential hypertension   Hip fracture (HCC)   Heart murmur   Abnormal CXR   Pulsatile neck mass   Hyperglycemia   Elevated troponin   Leukocytosis   CAP (community acquired pneumonia)   Elevated TSH   Closed displaced fracture of right femoral neck (HCC)   1. Right femoral neck fracture: present on admission most likely secondary to fall - 83 y.o. Status post right total hip arthroplasty, postoperative day #1 today. Follow further recommendations from orthopedics. -PT/OT evaluation  2. Mechanical fall: Fall precautions  3. Positive troponins: Probably due to demand ischemia. Cardiology evaluation appreciated. Echo shows ejection fraction of 65-70% with grade 1 diastolic dysfunction. No further workup indicated currently - Patient is chest pain-free  4. Hypertension: Monitor blood pressure. Increase amlodipine to 5 mg daily  5. Leukocytosis: Probably from community acquired pneumonia, improving. Repeat a.m. Labs. Continue antibiotics.   6. Diabetes mellitus type 2 with hyperglycemia: This is a new diagnosis for the patient - Hemoglobin A1c is 7. - Dietary evaluation. Follow recommendations from diabetes coordinator  7. Dehydration: Improved. Discontinue IV fluids  8. Community acquired pneumonia: Follow-up cultures. Continue Rocephin and Zithromax.  9. Dyslipidemia: Continue statins  10.  Hypothyroidism: Continue levothyroxine   11. Intermittent confusion: Secondary to delirium versus undiagnosed dementia; monitor mental status  DVT prophylaxis: SCDs Code Status:  Full Family Communication:  discussed with son present at bedside  Disposition Plan: Might need nursing home placement depending on PT evaluation and recommendations  Consultants: Orthopedics and cardiology   Procedures: Echo 05/10/2017: Ejection fraction of 65-70% with grade 1 diastolic dysfunction  Antimicrobials: Rocephin and Zithromax from 81/23/2018  Subjective: Patient seen and examined at bedside. She is more awake this morning but still slightly confused. She answer some questions and denies any overnight fever, nausea, chest pain.  Objective: Vitals:   05/10/17 1820 05/10/17 2125 05/11/17 0038 05/11/17 0456  BP: (!) 155/73 (!) 159/58 (!) 155/68 (!) 154/53  Pulse: 79 75 88 83  Resp: 16 16 18 18   Temp:  97.5 F (36.4 C) 98.4 F (36.9 C) 98.2 F (36.8 C)  TempSrc:  Oral Oral Oral  SpO2:  97% 96% 95%  Weight:      Height:        Intake/Output Summary (Last 24 hours) at 05/11/17 1057 Last data filed at 05/11/17 0641  Gross per 24 hour  Intake          3136.25 ml  Output             1055 ml  Net          2081.25 ml   Filed Weights   05/09/17 2149  Weight: 66.6 kg (146 lb 13.2 oz)    Examination:  General exam: Appears calm and comfortable  Respiratory system: Bilateral decreased breath sound at bases With scattered crackles Cardiovascular system: S1 & S2 heard, rate controlled  Gastrointestinal system: Abdomen is nondistended, soft and nontender. Normal bowel sounds heard. Central nervous system: Alert and oriented x2, not oriented to time. No focal neurological deficits. Moving extremities Extremities: No cyanosis, clubbing; trace pedal edema    Data Reviewed: I have personally reviewed following labs and imaging studies  CBC:  Recent Labs Lab 05/09/17 1553 05/10/17 0358  05/11/17 0525  WBC 18.4* 15.9* 11.7*  NEUTROABS  --   --  8.8*  HGB 15.5* 14.1 10.8*  HCT 44.6 41.5 33.2*  MCV 88.8 90.0 91.7  PLT 355 321 272   Basic Metabolic Panel:  Recent Labs Lab 05/09/17 1553 05/10/17 0103 05/10/17 0358 05/11/17 0525  NA 136  --  139 141  K 4.2  --  3.6 3.5  CL 101  --  104 108  CO2 26  --  26 25  GLUCOSE 271* 349* 208* 168*  BUN 38*  --  36* 24*  CREATININE 0.81  --  0.76 0.56  CALCIUM 9.4  --  9.3 8.3*  MG 2.7*  --  2.3 2.0  PHOS 3.5  --  2.7  --    GFR: Estimated Creatinine Clearance: 48.5 mL/min (by C-G formula based on SCr of 0.56 mg/dL). Liver Function Tests:  Recent Labs Lab 05/10/17 0358  AST 27  ALT 24  ALKPHOS 59  BILITOT 0.6  PROT 6.5  ALBUMIN 3.4*   No results for input(s): LIPASE, AMYLASE in the last 168 hours. No results for input(s): AMMONIA in the last 168 hours. Coagulation Profile: No results for input(s): INR, PROTIME in the last 168 hours. Cardiac Enzymes:  Recent Labs Lab 05/09/17 1553 05/09/17 2335 05/10/17 0358 05/10/17 0956  CKTOTAL 210  --   --   --   TROPONINI 0.16* 0.18* 0.13* 0.15*   BNP (last 3 results) No results for input(s): PROBNP in the last 8760 hours. HbA1C:  Recent Labs  05/09/17 1553  HGBA1C 7.0*   CBG:  Recent Labs Lab 05/10/17 1726 05/10/17 2120 05/11/17 0036 05/11/17 0552 05/11/17 0823  GLUCAP 229* 206* 206* 170* 168*   Lipid Profile:  Recent Labs  05/10/17 0358  CHOL 250*  HDL 82  LDLCALC 143*  TRIG 125  CHOLHDL 3.0   Thyroid Function Tests:  Recent Labs  05/09/17 2335 05/10/17 0358  TSH  --  8.315*  FREET4 0.96  --    Anemia Panel: No results for input(s): VITAMINB12, FOLATE, FERRITIN, TIBC, IRON, RETICCTPCT in the last 72 hours. Sepsis Labs: No results for input(s): PROCALCITON, LATICACIDVEN in the last 168 hours.  Recent Results (from the past 240 hour(s))  Surgical PCR screen     Status: None   Collection Time: 05/09/17 11:07 PM  Result Value  Ref Range Status   MRSA, PCR NEGATIVE NEGATIVE Final   Staphylococcus aureus NEGATIVE NEGATIVE Final    Comment:        The Xpert SA Assay (FDA approved for NASAL specimens in patients over 81 years of age), is one component of a comprehensive surveillance program.  Test performance has been validated by [facility] for patients greater than or equal to 81 year old. It is not intended to diagnose infection nor to guide or monitor treatment.          Radiology Studies: Dg Chest 2 View  Result Date: 05/09/2017 CLINICAL DATA:  Fall, fell on Sunday evening and was found today in prone position with shortening and rotation of the RIGHT leg, RIGHT hip pain radiating to knee EXAM:  CHEST  2 VIEW COMPARISON:  09/23/2010 FINDINGS: Normal heart size, mediastinal contours, and pulmonary vascularity. Scattered atelectasis versus infiltrate in LEFT lung. Unable to exclude LEFT perihilar mass with this appearance. Tiny LEFT pleural effusion blunts the lateral costophrenic angle. Remaining lungs clear. No pneumothorax. Atherosclerotic calcification aorta. Bones demineralized. IMPRESSION: Scattered infiltrate versus atelectasis in LEFT lung, unable to exclude LEFT perihilar mass; when the patient's clinical condition permits, recommend follow-up CT chest with contrast to exclude neoplasm. Electronically Signed   By: Ulyses Southward M.D.   On: 05/09/2017 17:47   Ct Angio Neck W And/or Wo Contrast  Result Date: 05/09/2017 CLINICAL DATA:  Patient found down and side her house after multiple days. Recent fall EXAM: CT ANGIOGRAPHY NECK TECHNIQUE: Multidetector CT imaging of the neck was performed using the standard protocol during bolus administration of intravenous contrast. Multiplanar CT image reconstructions and MIPs were obtained to evaluate the vascular anatomy. Carotid stenosis measurements (when applicable) are obtained utilizing NASCET criteria, using the distal internal carotid diameter as the  denominator. CONTRAST:  100 mL Isovue 370 COMPARISON:  None. FINDINGS: Aortic arch: There is atherosclerotic calcification of the aortic arch without stenosis. There is a normal 3 vessel branching pattern. Both subclavian arteries are widely patent. Right carotid system: The right common carotid artery is normal. The right internal carotid artery is normal without stenosis. Left carotid system: The left common carotid artery and internal carotid artery are normal. Vertebral arteries: Vertebral arteries are mildly right-dominant. Both vertebral artery origins are normal. The remainder of the vertebral arteries are normal to their confluence at the basilar artery. Skeleton: Multilevel facet hypertrophy but no bony spinal canal stenosis. No lytic or blastic lesions. Other neck: Thyroid is normal. No cervical lymphadenopathy. Salivary glands are normal. No focal abnormality of the pharynx or larynx. Upper chest: Please see dedicated report for CTA of the chest. IMPRESSION: 1. No aneurysm, dissection or hemodynamically significant stenosis of the carotid or vertebral arteries. 2. Aortic atherosclerosis. 3. Please see dedicated report for CTA of the chest for findings below the thoracic inlet. Electronically Signed   By: Deatra Robinson M.D.   On: 05/09/2017 21:22   Ct Angio Chest Pe W Or Wo Contrast  Result Date: 05/09/2017 CLINICAL DATA:  Fall, abnormal chest x-ray EXAM: CT ANGIOGRAPHY CHEST WITH CONTRAST TECHNIQUE: Multidetector CT imaging of the chest was performed using the standard protocol during bolus administration of intravenous contrast. Multiplanar CT image reconstructions and MIPs were obtained to evaluate the vascular anatomy. CONTRAST:  100 mL Isovue 370 intravenous COMPARISON:  Radiograph 05/09/2017 FINDINGS: Cardiovascular: Satisfactory opacification of the pulmonary arteries to the segmental level. No evidence of pulmonary embolism. Non aneurysmal aorta. No dissection. Aortic atherosclerosis. Mild  coronary artery calcification. Borderline heart size. No pericardial effusion. Mediastinum/Nodes: No enlarged mediastinal, hilar, or axillary lymph nodes. Thyroid gland, trachea, and esophagus demonstrate no significant findings. Lungs/Pleura: Mild consolidation in the lingula. No pleural effusion or pneumothorax. Upper Abdomen: Partially visualized 2.5 cm low-density lesion in the left upper quadrant. Musculoskeletal: No chest wall abnormality. No acute or significant osseous findings. Review of the MIP images confirms the above findings. IMPRESSION: 1. Negative for acute pulmonary embolus or aortic dissection. No evidence for left hilar mass 2. Partially visualized 2.5 cm low-attenuation lesion left upper quadrant possibly a cyst from the kidney. Electronically Signed   By: Jasmine Pang M.D.   On: 05/09/2017 21:20   Pelvis Portable  Result Date: 05/10/2017 CLINICAL DATA:  Right anterior hip replacement; fluoro time 22  min; Post op right hip surgery today. EXAM: DG C-ARM 61-120 MIN-NO REPORT; PORTABLE PELVIS 1-2 VIEWS COMPARISON:  05/10/2017 FINDINGS: The patient has undergone right total hip arthroplasty. The femoral head component is well seated in the acetabular component on this frontal view. Soft tissue gas is identified in the right proximal thigh. Foley catheter is in place. There is deformity of the right inferior pubic ramus, favoring remote fracture. IMPRESSION: 1. Status post right total hip arthroplasty without adverse features. 2. Suspect remote fracture of the right inferior pubic ramus. Electronically Signed   By: Norva Pavlov M.D.   On: 05/10/2017 17:06   Dg Knee Complete 4 Views Right  Result Date: 05/09/2017 CLINICAL DATA:  Fall, fell on Sunday evening and was found today in prone position with shortening and rotation of the RIGHT leg, RIGHT hip pain radiating to knee EXAM: RIGHT KNEE - COMPLETE 4+ VIEW COMPARISON:  None FINDINGS: Diffuse osseous demineralization. Medial compartment  joint space narrowing spur formation. Mild chondrocalcinosis in medial compartment question CPPD. No acute fracture, dislocation, or bone destruction. No knee joint effusion. IMPRESSION: Osseous demineralization with mild degenerative changes and question CPPD RIGHT knee. No acute abnormalities. Please refer to report of RIGHT hip radiograph exam. Electronically Signed   By: Ulyses Southward M.D.   On: 05/09/2017 17:45   Dg C-arm 61-120 Min-no Report  Result Date: 05/10/2017 Fluoroscopy was utilized by the requesting physician.  No radiographic interpretation.   Dg Hip Operative Unilat W Or W/o Pelvis Right  Result Date: 05/10/2017 CLINICAL DATA:  83 year old female with a history of right hip replacement EXAM: OPERATIVE RIGHT HIP (WITH PELVIS IF PERFORMED)  VIEWS TECHNIQUE: Fluoroscopic spot image(s) were submitted for interpretation post-operatively. COMPARISON:  05/09/2017 FINDINGS: Multiple intraoperative fluoroscopic spot images demonstrate surgical changes of right hip arthroplasty for repair of femoral neck fracture. IMPRESSION: Intraoperative fluoroscopic spot images demonstrate surgical changes of right hip arthroplasty for repair of femoral neck fracture. Please refer to the dictated operative report for full details of intraoperative findings and procedure. Electronically Signed   By: Gilmer Mor D.O.   On: 05/10/2017 15:47   Dg Hip Unilat W Or Wo Pelvis 2-3 Views Right  Result Date: 05/09/2017 CLINICAL DATA:  Fall EXAM: DG HIP (WITH OR WITHOUT PELVIS) 2-3V RIGHT COMPARISON:  None. FINDINGS: Old appearing right inferior pubic ramus fracture. Pubic symphysis is intact. No right femoral head dislocation. Foreshortened appearance of the right femoral neck with suspected transcervical fracture. IMPRESSION: 1. Foreshortened appearance of the right femoral neck with suspected transcervical fracture. 2. Old appearing right inferior pubic ramus fracture Electronically Signed   By: Jasmine Pang M.D.    On: 05/09/2017 17:45   Dg Femur, Min 2 Views Right  Result Date: 05/09/2017 CLINICAL DATA:  Fall, fell on Sunday evening and was found today in prone position with shortening and rotation of the RIGHT leg, RIGHT hip pain radiating to knee EXAM: RIGHT FEMUR 2 VIEWS COMPARISON:  None FINDINGS: Osseous mineralization. RIGHT hip imaged and reported separately. Remainder of RIGHT femur appears intact without fracture or dislocation. Knee joint alignment grossly normal. IMPRESSION: Osseous demineralization without acute abnormalities. Please refer to report of RIGHT hip radiographs. Electronically Signed   By: Ulyses Southward M.D.   On: 05/09/2017 17:44        Scheduled Meds: . amLODipine  2.5 mg Oral Daily  . aspirin EC  81 mg Oral BID WC  . atorvastatin  20 mg Oral q1800  . chlorhexidine  15 mL Mouth  Rinse BID  . feeding supplement (ENSURE ENLIVE)  237 mL Oral BID BM  . guaiFENesin  600 mg Oral BID  . insulin aspart  0-9 Units Subcutaneous Q4H  . levothyroxine  50 mcg Oral QAC breakfast  . living well with diabetes book   Does not apply Once  . mouth rinse  15 mL Mouth Rinse q12n4p  . prednisoLONE acetate  1 drop Right Eye QID   Continuous Infusions: . sodium chloride 75 mL/hr at 05/11/17 0603  . azithromycin Stopped (05/10/17 2347)  . cefTRIAXone (ROCEPHIN)  IV Stopped (05/10/17 2234)  . methocarbamol (ROBAXIN)  IV Stopped (05/10/17 1702)     LOS: 2 days        Glade Lloyd, MD [facility] Pager [phone]  If 7PM-7AM, please contact night-coverage [email or web address] Password [facility] 05/11/2017, 10:57 AM

## 2017-05-11 NOTE — Progress Notes (Signed)
CSW met with son, [name] [phone], to assist with d/c planning. PT recommendations reviewed and son agrees with plan for [facility]. SNF search initiated and bed offers provided. Son is touring facilities today and will contact CSW will bed choice. Weekend CSW will assist with d/c planning needs ( [phone] ).  Werner Lean LCSW [phone]

## 2017-05-11 NOTE — Progress Notes (Signed)
   Subjective:  Patient reports pain as mild to moderate.  No c/o.   Objective:   VITALS:   Vitals:   05/10/17 2125 05/11/17 0038 05/11/17 0456 05/11/17 1303  BP: (!) 159/58 (!) 155/68 (!) 154/53 (!) 150/55  Pulse: 75 88 83   Resp: 16 18 18 18   Temp: 97.5 F (36.4 C) 98.4 F (36.9 C) 98.2 F (36.8 C) 98.2 F (36.8 C)  TempSrc: Oral Oral Oral Oral  SpO2: 97% 96% 95% 93%  Weight:      Height:        NAD ABD soft Intact pulses distally Dorsiflexion/Plantar flexion intact Incision: dressing C/D/I Compartment soft   Lab Results  Component Value Date   WBC 11.7 (H) 05/11/2017   HGB 10.8 (L) 05/11/2017   HCT 33.2 (L) 05/11/2017   MCV 91.7 05/11/2017   PLT 272 05/11/2017   BMET    Component Value Date/Time   NA 141 05/11/2017 0525   K 3.5 05/11/2017 0525   CL 108 05/11/2017 0525   CO2 25 05/11/2017 0525   GLUCOSE 168 (H) 05/11/2017 0525   BUN 24 (H) 05/11/2017 0525   CREATININE 0.56 05/11/2017 0525   CALCIUM 8.3 (L) 05/11/2017 0525   GFRNONAA >60 05/11/2017 0525   GFRAA >60 05/11/2017 0525     Assessment/Plan: 1 Day Post-Op   Active Problems:   Fracture of femoral neck, right (HCC)   Essential hypertension   Hip fracture (HCC)   Heart murmur   Abnormal CXR   Pulsatile neck mass   Hyperglycemia   Elevated troponin   Leukocytosis   CAP (community acquired pneumonia)   Elevated TSH   Closed displaced fracture of right femoral neck (HCC)   WBAT with walker  DVT ppx: ASA 81 mg PO BID x 6 weeks, SCDs, TEDs PO pain control PT/OT Dispo: D/C to SNF when medically ready   Evert Wenrich, Cloyde ReamsBrian James 05/11/2017, 5:26 PM   Samson FredericBrian Romonda Parker, MD Cell [phone]

## 2017-05-11 NOTE — Progress Notes (Signed)
Inpatient Diabetes Program Recommendations  AACE/ADA: New Consensus Statement on Inpatient Glycemic Control (2015)  Target Ranges:  Prepandial:   less than 140 mg/dL      Peak postprandial:   less than 180 mg/dL (1-2 hours)      Critically ill patients:  140 - 180 mg/dL    Results for Angela [name], Angela [name] (MRN [id number]) as of 05/11/2017 13:38  Ref. Range 05/09/2017 15:53  Hemoglobin A1C Latest Ref Range: 4.8 - 5.6 % 7.0 (H)    MD- Note new diagnosis of DM.  Spoke with pt and her son today about this new diagnosis.  Have ordered basic diabetes educational tools for pt and have asked nursing staff to please work with patient on learning how to check fingerstick CBGs.  RD to see pt this admission to discuss basic DM diet information.   At time of discharge, given patient's age, would allow patient to attempt lifestyle changes at home to control CBGs.     --Will follow patient during hospitalization--  Ambrose FinlandJeannine Johnston Jayvon Mounger RN, MSN, CDE Diabetes Coordinator Inpatient Glycemic Control Team Team Pager: [phone] (8a-5p)

## 2017-05-11 NOTE — Evaluation (Signed)
Occupational Therapy Evaluation Patient Details Name: Angela [name] MRN: [id number] DOB: 1933-09-07 Today's Date: 05/11/2017    History of Present Illness 83 yo female admitted with a R femoral neck fracture. S/P R THA-direct anterior 05/10/17.    Clinical Impression   Pt was admitted for the above.  She will benefit from continued OT to increase safety and independence with adls. Pt was independent prior to sx. She currently needs min A for transfers and up to max A for LB adls. Goals are for min A in acute setting.    Follow Up Recommendations  SNF    Equipment Recommendations  3 in 1 bedside commode    Recommendations for Other Services       Precautions / Restrictions Precautions Precautions: Fall Restrictions Weight Bearing Restrictions: No RLE Weight Bearing: Weight bearing as tolerated      Mobility Bed Mobility Overal bed mobility: Needs Assistance Bed Mobility: Supine to Sit     Supine to sit: Mod assist;HOB elevated Sit to supine: Mod assist;HOB elevated   General bed mobility comments: assist for leg and trunk sitting up and assist for bil LEs getting back to bed.  Transfers Overall transfer level: Needs assistance Equipment used: Rolling walker (2 wheeled) Transfers: Sit to/from Stand Sit to Stand: Min assist;From elevated surface         General transfer comment: assist to rise and stabilize. Cues for UE/LE placement    Balance                                           ADL either performed or assessed with clinical judgement   ADL Overall ADL's : Needs assistance/impaired Eating/Feeding: Independent   Grooming: Min guard;Standing;Oral care   Upper Body Bathing: Set up;Sitting   Lower Body Bathing: Moderate assistance;Sit to/from stand   Upper Body Dressing : Minimal assistance;Sitting   Lower Body Dressing: Total assistance;Sit to/from stand   Toilet Transfer: Minimal assistance;Ambulation;BSC;RW   Toileting-  Clothing Manipulation and Hygiene: Minimal assistance;Sit to/from stand         General ADL Comments: ambulated to bathroom and performed toilet transfer; catheter in place.  Stood for oral care.  Pt needed multimodal cues for sequence and safety. Tends to take very small steps/difficulty advancing RLE.       Vision         Perception     Praxis      Pertinent Vitals/Pain Pain Assessment: Faces Faces Pain Scale: Hurts even more Pain Location: R hip with activity Pain Descriptors / Indicators: Sore;Aching;Grimacing Pain Intervention(s): Limited activity within patient's tolerance;Monitored during session;Premedicated before session;Repositioned;Ice applied     Hand Dominance     Extremity/Trunk Assessment Upper Extremity Assessment Upper Extremity Assessment: Generalized weakness   Lower Extremity Assessment Lower Extremity Assessment: Generalized weakness (s/p R THA)   Cervical / Trunk Assessment Cervical / Trunk Assessment: Normal   Communication Communication Communication: No difficulties   Cognition Arousal/Alertness: Awake/alert Behavior During Therapy: WFL for tasks assessed/performed Overall Cognitive Status: Impaired/Different from baseline Area of Impairment: Following commands;Safety/judgement;Problem solving                       Following Commands: Follows one step commands consistently (with multimodal cues) Safety/Judgement: Decreased awareness of safety   Problem Solving: Difficulty sequencing;Slow processing;Requires verbal cues;Requires tactile cues General Comments: slow processing; multimodal cues needed.  Pt  kept releasing walker and reaching for other objects in environment   General Comments       Exercises     Shoulder Instructions      Home Living Family/patient expects to be discharged to:: Skilled nursing facility Living Arrangements: Alone Available Help at Discharge: Family (son is in town for 5 days) Type of Home:  House Home Access: Stairs to enter Secretary/administrator of Steps: 5 Entrance Stairs-Rails: Right Home Layout: Laundry or work area in basement;One level (office in basement)               Home Equipment: None   Additional Comments: son here for 5 days      Prior Functioning/Environment Level of Independence: Independent                 OT Problem List: Decreased strength;Decreased activity tolerance;Pain;Decreased safety awareness;Decreased cognition;Decreased knowledge of use of DME or AE      OT Treatment/Interventions: Self-care/ADL training;DME and/or AE instruction;Patient/family education    OT Goals(Current goals can be found in the care plan section) Acute Rehab OT Goals Patient Stated Goal: less pain. regain PLOF.  OT Goal Formulation: With patient Time For Goal Achievement: 05/18/17 Potential to Achieve Goals: Good ADL Goals Pt Will Perform Grooming: with supervision;standing Pt Will Perform Lower Body Bathing: with min guard assist;with adaptive equipment;sit to/from stand Pt Will Perform Lower Body Dressing: with min guard assist;with adaptive equipment;sit to/from stand Optometrist) Pt Will Transfer to Toilet: with min guard assist;ambulating;bedside commode Pt Will Perform Toileting - Clothing Manipulation and hygiene: with min guard assist;sit to/from stand Additional ADL Goal #1: pt will perform bed mobility at min A level in preparation for adls  OT Frequency: Min 2X/week   Barriers to D/C:            Co-evaluation              AM-PAC PT "6 Clicks" Daily Activity     Outcome Measure Help from another person eating meals?: None Help from another person taking care of personal grooming?: A Little Help from another person toileting, which includes using toliet, bedpan, or urinal?: A Little Help from another person bathing (including washing, rinsing, drying)?: A Lot Help from another person to put on and taking off regular upper body  clothing?: A Little Help from another person to put on and taking off regular lower body clothing?: A Lot 6 Click Score: 17   End of Session    Activity Tolerance: Patient limited by fatigue;Patient limited by pain Patient left: in bed;with call bell/phone within reach (with PT)  OT Visit Diagnosis: Muscle weakness (generalized) (M62.81);Pain Pain - Right/Left: Right Pain - part of body: Hip                Time: 1610-9604 OT Time Calculation (min): 29 min Charges:  OT General Charges $OT Visit: 1 Procedure OT Evaluation $OT Eval Low Complexity: 1 Procedure OT Treatments $Self Care/Home Management : 8-22 mins G-Codes:     [place], OTR/L [phone] 05/11/2017  Angela [name] 05/11/2017, 3:07 PM

## 2017-05-11 NOTE — Evaluation (Signed)
Physical Therapy Evaluation Patient Details Name: Angela [name] MRN: [id number] DOB: August 12, 1933 Today's Date: 05/11/2017   History of Present Illness  83 yo female admitted with a R femoral neck fracture. S/P R THA-direct anterior 05/10/17.   Clinical Impression  On eval, pt required Mod assist for mobility. She walked ~15 feet with a RW. Pain rated "20" while ambulating (face pain assessment scale ~8/10). Pt has been declining pain meds. Pt presents with general weakness, decreased activity, tolerance, and impaired gait and balance. At this time, recommendation is for ST re[facility] at [facility]. Will follow and progress activity as tolerated.     Follow Up Recommendations SNF    Equipment Recommendations  Rolling walker with 5" wheels;3in1 (PT)    Recommendations for Other Services       Precautions / Restrictions Precautions Precautions: Fall Restrictions Weight Bearing Restrictions: No RLE Weight Bearing: Weight bearing as tolerated      Mobility  Bed Mobility Overal bed mobility: Needs Assistance Bed Mobility: Supine to Sit     Supine to sit: Mod assist;HOB elevated     General bed mobility comments: Assist for trunk and R LE. Pt relied heavily on bedrail. Increased time. Cues for safety, technique.   Transfers Overall transfer level: Needs assistance Equipment used: Rolling walker (2 wheeled) Transfers: Sit to/from Stand Sit to Stand: From elevated surface;Min assist         General transfer comment: Assist to rise, stabilize, control descent. VCs safety, technique, hand/LE placement.   Ambulation/Gait Ambulation/Gait assistance: Min assist;+2 safety/equipment Ambulation Distance (Feet): 15 Feet Assistive device: Rolling walker (2 wheeled) Gait Pattern/deviations: Step-to pattern;Decreased stride length;Decreased step length - right;Decreased step length - left;Antalgic     General Gait Details: Mod VCs for safety, technique, sequence, step length. Son followed with  recliner. Distance was limited by pain, fatigue.   Stairs            Wheelchair Mobility    Modified Rankin (Stroke Patients Only)       Balance                                             Pertinent Vitals/Pain Pain Assessment: Faces Faces Pain Scale: Hurts whole lot Pain Location: R hip with activity Pain Descriptors / Indicators: Sore;Aching;Grimacing Pain Intervention(s): Limited activity within patient's tolerance;Repositioned    Home Living Family/patient expects to be discharged to:: Skilled nursing facility Living Arrangements: Alone Available Help at Discharge: Family (son is in town for 5 days) Type of Home: House Home Access: Stairs to enter Entrance Stairs-Rails: Right Entrance Stairs-Number of Steps: 5 Home Layout: Laundry or work area in basement;One level (office in basement) Home Equipment: None      Prior Function Level of Independence: Independent               Hand Dominance        Extremity/Trunk Assessment   Upper Extremity Assessment Upper Extremity Assessment: Defer to OT evaluation    Lower Extremity Assessment Lower Extremity Assessment: Generalized weakness (s/p R THA)    Cervical / Trunk Assessment Cervical / Trunk Assessment: Normal  Communication   Communication: No difficulties  Cognition Arousal/Alertness: Awake/alert Behavior During Therapy: WFL for tasks assessed/performed Overall Cognitive Status: Within Functional Limits for tasks assessed  General Comments: some minor difficulty with providing home environment info-son interjected 1-2 x      General Comments      Exercises     Assessment/Plan    PT Assessment Patient needs continued PT services  PT Problem List Decreased strength;Decreased mobility;Decreased range of motion;Decreased activity tolerance;Decreased balance;Decreased knowledge of use of DME;Pain;Decreased cognition        PT Treatment Interventions DME instruction;Therapeutic activities;Patient/family education;Therapeutic exercise;Gait training;Stair training;Balance training;Functional mobility training    PT Goals (Current goals can be found in the Care Plan section)  Acute Rehab PT Goals Patient Stated Goal: less pain. regain PLOF.  PT Goal Formulation: With patient/family Time For Goal Achievement: 05/25/17 Potential to Achieve Goals: Good    Frequency Min 5X/week   Barriers to discharge        Co-evaluation               AM-PAC PT "6 Clicks" Daily Activity  Outcome Measure Difficulty turning over in bed (including adjusting bedclothes, sheets and blankets)?: A Lot Difficulty moving from lying on back to sitting on the side of the bed? : A Lot Difficulty sitting down on and standing up from a chair with arms (e.g., wheelchair, bedside commode, etc,.)?: A Little Help needed moving to and from a bed to chair (including a wheelchair)?: A Little Help needed walking in hospital room?: A Little Help needed climbing 3-5 steps with a railing? : A Lot 6 Click Score: 15    End of Session Equipment Utilized During Treatment: Gait belt Activity Tolerance: Patient limited by fatigue;Patient limited by pain Patient left: in chair;with call bell/phone within reach;with family/visitor present;with chair alarm set   PT Visit Diagnosis: Muscle weakness (generalized) (M62.81);Difficulty in walking, not elsewhere classified (R26.2)    Time: 1140-1200 PT Time Calculation (min) (ACUTE ONLY): 20 min   Charges:   PT Evaluation $PT Eval Low Complexity: 1 Procedure     PT G Codes:        Rebeca AlertJannie Akyla [name], MPT Pager: [phone]

## 2017-05-12 LAB — BASIC METABOLIC PANEL
Anion gap: 8 (ref 5–15)
BUN: 16 mg/dL (ref 6–20)
CO2: 26 mmol/L (ref 22–32)
Calcium: 8.3 mg/dL — ABNORMAL LOW (ref 8.9–10.3)
Chloride: 107 mmol/L (ref 101–111)
Creatinine, Ser: 0.56 mg/dL (ref 0.44–1.00)
GFR calc Af Amer: >60 mL/min (ref >60)
GLUCOSE: 176 mg/dL — AB (ref 65–99)
Potassium: 3.3 mmol/L — ABNORMAL LOW (ref 3.5–5.1)
Sodium: 141 mmol/L (ref 135–145)

## 2017-05-12 LAB — CBC
HCT: 30.8 % — ABNORMAL LOW (ref 36.0–46.0)
Hemoglobin: 10.2 g/dL — ABNORMAL LOW (ref 12.0–15.0)
MCH: 30.3 pg (ref 26.0–34.0)
MCHC: 33.1 g/dL (ref 30.0–36.0)
MCV: 91.4 fL (ref 78.0–100.0)
PLATELETS: 288 10*3/uL (ref 150–400)
RBC: 3.37 MIL/uL — ABNORMAL LOW (ref 3.87–5.11)
RDW: 12.5 % (ref 11.5–15.5)
WBC: 12.2 10*3/uL — AB (ref 4.0–10.5)

## 2017-05-12 LAB — GLUCOSE, CAPILLARY
GLUCOSE-CAPILLARY: 179 mg/dL — AB (ref 65–99)
Glucose-Capillary: 172 mg/dL — ABNORMAL HIGH (ref 65–99)
Glucose-Capillary: 162 mg/dL — ABNORMAL HIGH (ref 65–99)
Glucose-Capillary: 205 mg/dL — ABNORMAL HIGH (ref 65–99)
Glucose-Capillary: 200 mg/dL — ABNORMAL HIGH (ref 65–99)

## 2017-05-12 MED ORDER — POTASSIUM CHLORIDE CRYS ER 20 MEQ PO TBCR
40.0000 meq | EXTENDED_RELEASE_TABLET | Freq: Once | ORAL | Status: AC
  Administered 2017-05-12: 40 meq via ORAL
  Filled 2017-05-12: qty 2

## 2017-05-12 MED ORDER — LEVOFLOXACIN 500 MG PO TABS
500.0000 mg | ORAL_TABLET | Freq: Every day | ORAL | Status: DC
Start: 2017-05-12 — End: 2017-05-13
  Administered 2017-05-12: 500 mg via ORAL
  Filled 2017-05-12: qty 1

## 2017-05-12 MED ORDER — METFORMIN HCL 500 MG PO TABS
500.0000 mg | ORAL_TABLET | Freq: Two times a day (BID) | ORAL | Status: DC
  Administered 2017-05-12 – 2017-05-13 (×2): 500 mg via ORAL
  Filled 2017-05-12 (×2): qty 1

## 2017-05-12 NOTE — Progress Notes (Signed)
Physical Therapy Treatment Patient Details Name: Angela [name] MRN: [id number] DOB: Jul 08, 1933 Today's Date: 05/12/2017    History of Present Illness 83 yo female admitted with a R femoral neck fracture. S/P R THA-direct anterior 05/10/17.     PT Comments    Progressing slowly with mobility. Cognition still somewhat impaired. Son present during session again on today. Continue to recommend SNF.   Follow Up Recommendations  SNF     Equipment Recommendations       Recommendations for Other Services       Precautions / Restrictions Precautions Precautions: Fall Restrictions Weight Bearing Restrictions: No RLE Weight Bearing: Weight bearing as tolerated    Mobility  Bed Mobility Overal bed mobility: Needs Assistance Bed Mobility: Supine to Sit     Supine to sit: Mod assist;HOB elevated     General bed mobility comments: Assist for trunk and R LE. Increased time. Multimodal cueing required for safety, technique.   Transfers Overall transfer level: Needs assistance Equipment used: Rolling walker (2 wheeled) Transfers: Sit to/from [employer] Sit to Stand: Min assist Stand pivot transfers: Min assist       General transfer comment: Assist to rise, stabilize, control descent. . Cues for UE/LE placement  Ambulation/Gait Ambulation/Gait assistance: Min assist Ambulation Distance (Feet): 50 Feet Assistive device: Rolling walker (2 wheeled) Gait Pattern/deviations: Step-to pattern;Step-through pattern;Decreased stride length;Decreased step length - right     General Gait Details: Mod VCs for safety, sequence, step length. Slow gait speed. Pt fatigues fairly easily. Assist to stabilize pt and maneuver RW intermittently.    Stairs            Wheelchair Mobility    Modified Rankin (Stroke Patients Only)       Balance                                            Cognition Arousal/Alertness: Awake/alert Behavior During  Therapy: Flat affect Overall Cognitive Status: Impaired/Different from baseline Area of Impairment: Following commands;Problem solving                       Following Commands: Follows one step commands with increased time Safety/Judgement: Decreased awareness of safety   Problem Solving: Slow processing;Difficulty sequencing;Requires verbal cues;Requires tactile cues General Comments: Multimodal and repeated cueing needed.       Exercises Total Joint Exercises Ankle Circles/Pumps: AROM;Both;10 reps;Supine Quad Sets: AROM;Both;10 reps;Supine Heel Slides: AAROM;Right;10 reps;Supine Hip ABduction/ADduction: AAROM;Right;10 reps;Supine    General Comments        Pertinent Vitals/Pain Pain Assessment: Faces Faces Pain Scale: Hurts even more Pain Location: R hip with activity Pain Descriptors / Indicators: Sore;Tightness;Aching Pain Intervention(s): Limited activity within patient's tolerance;Repositioned    Home Living                      Prior Function            PT Goals (current goals can now be found in the care plan section) Progress towards PT goals: Progressing toward goals    Frequency    Min 5X/week      PT Plan Current plan remains appropriate    Co-evaluation              AM-PAC PT "6 Clicks" Daily Activity  Outcome Measure  Difficulty turning over in bed (including adjusting bedclothes,  sheets and blankets)?: A Lot Difficulty moving from lying on back to sitting on the side of the bed? : A Lot Difficulty sitting down on and standing up from a chair with arms (e.g., wheelchair, bedside commode, etc,.)?: A Little Help needed moving to and from a bed to chair (including a wheelchair)?: A Little Help needed walking in hospital room?: A Little Help needed climbing 3-5 steps with a railing? : A Lot 6 Click Score: 15    End of Session Equipment Utilized During Treatment: Gait belt Activity Tolerance: Patient limited by  fatigue;Patient limited by pain Patient left: in chair;with call bell/phone within reach;with chair alarm set;with family/visitor present   PT Visit Diagnosis: Muscle weakness (generalized) (M62.81);Difficulty in walking, not elsewhere classified (R26.2)     Time: [id number] PT Time Calculation (min) (ACUTE ONLY): 37 min  Charges:  $Gait Training: 8-22 mins $Therapeutic Exercise: 8-22 mins                    G Codes:          Rebeca AlertJannie Johnesha Acheampong, MPT Pager: [phone]

## 2017-05-12 NOTE — Clinical Social Work Note (Addendum)
CSW spoke with pt's son-[name] via phone [phone]) to get bed choice. [facility]. CSW discussed short-term rehab vs long term care and insurance. Pt not medically stable to discharge today. Son is aware pt will be discharged in 1-2 days (per MD note), but likely Sunday 5/27. Son plans to have SNF decision on that day.   2:37pm-CSW spoke with son, who confirmed [facility]. CSW confirmed with Kirstin [phone]) at [facility] that bed [facility] available for Sunday discharge. CSW continuing to follow for discharge needs.   Cherene JulianJeneya Ashland Wiseman, LCSWA, LCASA [place] CSW [phone]

## 2017-05-12 NOTE — Progress Notes (Signed)
Patient ID: Angela QuestAnn C [name], female   DOB: [birth year], 83 y.o.   MRN: [id number]  PROGRESS NOTE    Angela [name]  NWG:[id number] DOB: [birth year] DOA: 05/09/2017 PCP: Patient, No Pcp Per   Brief Narrative: 83 y.o.femalewith medical history significant of hypertesionpresented with a mechanical fall 3 days ago and was not able to get up. She was found to have right femoral neck fracture and slightly positive troponins. Orthopedics and cardiology were consulted. Patient had hip surgery done on 05/10/2017.  Assessment & Plan:   Active Problems:   Fracture of femoral neck, right (HCC)   Essential hypertension   Hip fracture (HCC)   Heart murmur   Abnormal CXR   Pulsatile neck mass   Hyperglycemia   Elevated troponin   Leukocytosis   CAP (community acquired pneumonia)   Elevated TSH   Closed displaced fracture of right femoral neck (HCC)  1. Right femoral neck fracture:present on admission most likely secondary to fall - Status post right total hip arthroplasty, postoperative day #2 today. Orthopedics follow-up appreciated. Outpatient follow-up with orthopedics -PT/OT evaluation  2. Mechanical fall: Fall precautions  3. Positive troponins: Probably due to demand ischemia. Cardiology evaluation appreciated. Echo shows ejection fraction of 65-70% with grade 1 diastolic dysfunction. No further workup indicated currently - Patient is chest pain-free  4. Hypertension: Monitor blood pressure. Increase amlodipine to 10 mg daily  5. Leukocytosis: Probably from community acquired pneumonia. Repeat a.m. Labs. Continue antibiotics.   6. Diabetes mellitus type 2 with hyperglycemia: This is a new diagnosis for the patient - Hemoglobin A1c is 7. - Dietary evaluation. Follow recommendations from diabetes coordinator - Start metformin 500 mg by mouth twice a day  7. Dehydration: Improved. Discontinue IV fluids  8. Community acquired pneumonia: Cultures negative so far. Discontinue  Rocephin and Zithromax. Start Levaquin 500 mg by mouth daily to finish a total course of antibiotic therapy for 5-7 days.  9. Dyslipidemia: Continue statins  10. Hypothyroidism: Continue levothyroxine   11. Intermittent confusion: Secondary to delirium versus undiagnosed dementia; monitor mental status. Patient might need outpatient neurology evaluation  12. Hypokalemia: Replace. Monitor labs  DVT prophylaxis:SCDs and aspirin Code Status:Full Family Communication: discussed with son present at bedside  Disposition Plan:  nursing home placement in 1-2 days Consultants:Orthopedics and cardiology   Procedures:Echo 05/10/2017: Ejection fraction of 65-70% with grade 1 diastolic dysfunction  Antimicrobials: Rocephin and Zithromax from 05/09/2017>> Subjective: Patient seen and examined at bedside. She is awake, answers some questions but is slightly confused.  Objective: Vitals:   05/11/17 0456 05/11/17 1303 05/11/17 2025 05/12/17 0444  BP: (!) 154/53 (!) 150/55 (!) 166/65 (!) 174/71  Pulse: 83  98 90  Resp: 18 18 19 20   Temp: 98.2 F (36.8 C) 98.2 F (36.8 C) 99.7 F (37.6 C) 98.1 F (36.7 C)  TempSrc: Oral Oral Oral Oral  SpO2: 95% 93% (!) 88% 95%  Weight:      Height:        Intake/Output Summary (Last 24 hours) at 05/12/17 1132 Last data filed at 05/12/17 0900  Gross per 24 hour  Intake              900 ml  Output              475 ml  Net              425 ml   Filed Weights   05/09/17 2149  Weight: 66.6 kg (146 lb 13.2 oz)  Examination:  General exam: Appears calm and comfortable  Respiratory system: Bilateral decreased breath sound at bases With scattered crackles Cardiovascular system: S1 & S2 heard, rate controlled  Gastrointestinal system: Abdomen is nondistended, soft and nontender. Normal bowel sounds heard. Central nervous system: Alert and oriented 2. No focal neurological deficits. Moving extremities Extremities: No cyanosis, clubbing;  trace pedal edema edema  Skin: No rashes, lesions or ulcers Lymph: No cervical lymphadenopathy  Data Reviewed: I have personally reviewed following labs and imaging studies  CBC:  Recent Labs Lab 05/09/17 1553 05/10/17 0358 05/11/17 0525 05/12/17 0540  WBC 18.4* 15.9* 11.7* 12.2*  NEUTROABS  --   --  8.8*  --   HGB 15.5* 14.1 10.8* 10.2*  HCT 44.6 41.5 33.2* 30.8*  MCV 88.8 90.0 91.7 91.4  PLT 355 321 272 288   Basic Metabolic Panel:  Recent Labs Lab 05/09/17 1553 05/10/17 0103 05/10/17 0358 05/11/17 0525 05/12/17 0540  NA 136  --  139 141 141  K 4.2  --  3.6 3.5 3.3*  CL 101  --  104 108 107  CO2 26  --  26 25 26   GLUCOSE 271* 349* 208* 168* 176*  BUN 38*  --  36* 24* 16  CREATININE 0.81  --  0.76 0.56 0.56  CALCIUM 9.4  --  9.3 8.3* 8.3*  MG 2.7*  --  2.3 2.0  --   PHOS 3.5  --  2.7  --   --    GFR: Estimated Creatinine Clearance: 48.5 mL/min (by C-G formula based on SCr of 0.56 mg/dL). Liver Function Tests:  Recent Labs Lab 05/10/17 0358  AST 27  ALT 24  ALKPHOS 59  BILITOT 0.6  PROT 6.5  ALBUMIN 3.4*   No results for input(s): LIPASE, AMYLASE in the last 168 hours. No results for input(s): AMMONIA in the last 168 hours. Coagulation Profile: No results for input(s): INR, PROTIME in the last 168 hours. Cardiac Enzymes:  Recent Labs Lab 05/09/17 1553 05/09/17 2335 05/10/17 0358 05/10/17 0956  CKTOTAL 210  --   --   --   TROPONINI 0.16* 0.18* 0.13* 0.15*   BNP (last 3 results) No results for input(s): PROBNP in the last 8760 hours. HbA1C:  Recent Labs  05/09/17 1553  HGBA1C 7.0*   CBG:  Recent Labs Lab 05/11/17 2022 05/12/17 0004 05/12/17 0445 05/12/17 0800 05/12/17 1118  GLUCAP 221* 164* 172* 162* 205*   Lipid Profile:  Recent Labs  05/10/17 0358  CHOL 250*  HDL 82  LDLCALC 143*  TRIG 125  CHOLHDL 3.0   Thyroid Function Tests:  Recent Labs  05/09/17 2335 05/10/17 0358  TSH  --  8.315*  FREET4 0.96  --     Anemia Panel: No results for input(s): VITAMINB12, FOLATE, FERRITIN, TIBC, IRON, RETICCTPCT in the last 72 hours. Sepsis Labs: No results for input(s): PROCALCITON, LATICACIDVEN in the last 168 hours.  Recent Results (from the past 240 hour(s))  Surgical PCR screen     Status: None   Collection Time: 05/09/17 11:07 PM  Result Value Ref Range Status   MRSA, PCR NEGATIVE NEGATIVE Final   Staphylococcus aureus NEGATIVE NEGATIVE Final    Comment:        The Xpert SA Assay (FDA approved for NASAL specimens in patients over 64 years of age), is one component of a comprehensive surveillance program.  Test performance has been validated by [facility] for patients greater than or equal to 35 year old. It  is not intended to diagnose infection nor to guide or monitor treatment.          Radiology Studies: Pelvis Portable  Result Date: 05/10/2017 CLINICAL DATA:  Right anterior hip replacement; fluoro time 22 min; Post op right hip surgery today. EXAM: DG C-ARM 61-120 MIN-NO REPORT; PORTABLE PELVIS 1-2 VIEWS COMPARISON:  05/10/2017 FINDINGS: The patient has undergone right total hip arthroplasty. The femoral head component is well seated in the acetabular component on this frontal view. Soft tissue gas is identified in the right proximal thigh. Foley catheter is in place. There is deformity of the right inferior pubic ramus, favoring remote fracture. IMPRESSION: 1. Status post right total hip arthroplasty without adverse features. 2. Suspect remote fracture of the right inferior pubic ramus. Electronically Signed   By: Norva Pavlov M.D.   On: 05/10/2017 17:06   Dg C-arm 61-120 Min-no Report  Result Date: 05/10/2017 Fluoroscopy was utilized by the requesting physician.  No radiographic interpretation.   Dg Hip Operative Unilat W Or W/o Pelvis Right  Result Date: 05/10/2017 CLINICAL DATA:  83 year old female with a history of right hip replacement EXAM: OPERATIVE RIGHT HIP (WITH  PELVIS IF PERFORMED)  VIEWS TECHNIQUE: Fluoroscopic spot image(s) were submitted for interpretation post-operatively. COMPARISON:  05/09/2017 FINDINGS: Multiple intraoperative fluoroscopic spot images demonstrate surgical changes of right hip arthroplasty for repair of femoral neck fracture. IMPRESSION: Intraoperative fluoroscopic spot images demonstrate surgical changes of right hip arthroplasty for repair of femoral neck fracture. Please refer to the dictated operative report for full details of intraoperative findings and procedure. Electronically Signed   By: Gilmer Mor D.O.   On: 05/10/2017 15:47        Scheduled Meds: . amLODipine  5 mg Oral Daily  . aspirin EC  81 mg Oral BID WC  . atorvastatin  20 mg Oral q1800  . chlorhexidine  15 mL Mouth Rinse BID  . feeding supplement (ENSURE ENLIVE)  237 mL Oral BID BM  . guaiFENesin  600 mg Oral BID  . insulin aspart  0-9 Units Subcutaneous Q4H  . levofloxacin  500 mg Oral QHS  . levothyroxine  50 mcg Oral QAC breakfast  . mouth rinse  15 mL Mouth Rinse q12n4p  . metFORMIN  500 mg Oral BID WC  . multivitamin with minerals  1 tablet Oral Daily  . potassium chloride  40 mEq Oral Once  . prednisoLONE acetate  1 drop Right Eye QID   Continuous Infusions: . methocarbamol (ROBAXIN)  IV Stopped (05/10/17 1702)     LOS: 3 days        Glade Lloyd, MD [facility] Pager [phone]  If 7PM-7AM, please contact night-coverage [email or web address] Password [facility] 05/12/2017, 11:32 AM

## 2017-05-13 ENCOUNTER — Inpatient Hospital Stay (HOSPITAL_COMMUNITY): Payer: Medicare Other

## 2017-05-13 DIAGNOSIS — E785 Hyperlipidemia, unspecified: Secondary | ICD-10-CM

## 2017-05-13 DIAGNOSIS — E1169 Type 2 diabetes mellitus with other specified complication: Secondary | ICD-10-CM

## 2017-05-13 DIAGNOSIS — I10 Essential (primary) hypertension: Secondary | ICD-10-CM

## 2017-05-13 LAB — BASIC METABOLIC PANEL
ANION GAP: 9 (ref 5–15)
BUN: 18 mg/dL (ref 6–20)
CALCIUM: 8.4 mg/dL — AB (ref 8.9–10.3)
CO2: 24 mmol/L (ref 22–32)
Chloride: 107 mmol/L (ref 101–111)
Creatinine, Ser: 0.61 mg/dL (ref 0.44–1.00)
GFR calc Af Amer: >60 mL/min (ref >60)
GFR calc non Af Amer: >60 mL/min (ref >60)
GLUCOSE: 189 mg/dL — AB (ref 65–99)
Potassium: 4.3 mmol/L (ref 3.5–5.1)
Sodium: 140 mmol/L (ref 135–145)

## 2017-05-13 LAB — GLUCOSE, CAPILLARY
GLUCOSE-CAPILLARY: 166 mg/dL — AB (ref 65–99)
Glucose-Capillary: 178 mg/dL — ABNORMAL HIGH (ref 65–99)
Glucose-Capillary: 190 mg/dL — ABNORMAL HIGH (ref 65–99)
Glucose-Capillary: 194 mg/dL — ABNORMAL HIGH (ref 65–99)

## 2017-05-13 LAB — CBC
HEMATOCRIT: 30.3 % — AB (ref 36.0–46.0)
Hemoglobin: 9.8 g/dL — ABNORMAL LOW (ref 12.0–15.0)
MCH: 29.3 pg (ref 26.0–34.0)
MCHC: 32.3 g/dL (ref 30.0–36.0)
MCV: 90.7 fL (ref 78.0–100.0)
Platelets: 326 10*3/uL (ref 150–400)
RBC: 3.34 MIL/uL — ABNORMAL LOW (ref 3.87–5.11)
RDW: 12.4 % (ref 11.5–15.5)
WBC: 10.5 10*3/uL (ref 4.0–10.5)

## 2017-05-13 MED ORDER — LEVOTHYROXINE SODIUM 50 MCG PO TABS: 50.0000 ug | ORAL_TABLET | Freq: Every day | ORAL | 0 refills | Status: AC

## 2017-05-13 MED ORDER — ATORVASTATIN CALCIUM 20 MG PO TABS: 20.0000 mg | ORAL_TABLET | Freq: Every day | ORAL | 0 refills | Status: AC

## 2017-05-13 MED ORDER — POLYETHYLENE GLYCOL 3350 17 G PO PACK: 17.0000 g | PACK | Freq: Every day | ORAL | 0 refills | Status: AC | PRN

## 2017-05-13 MED ORDER — LEVOFLOXACIN 500 MG PO TABS
500.0000 mg | ORAL_TABLET | Freq: Every day | ORAL | 0 refills | Status: AC
Start: 2017-05-13 — End: 2017-05-18

## 2017-05-13 MED ORDER — METFORMIN HCL 500 MG PO TABS: 500.0000 mg | ORAL_TABLET | Freq: Two times a day (BID) | ORAL | 0 refills | Status: AC

## 2017-05-13 NOTE — Care Management Note (Signed)
Case Management Note  Patient Details  Name: Angela [name] MRN: [id number] Date of Birth: 19-Oct-1933  Subjective/Objective:   Fracture of femoral neck, right. S/p right total hip arthroplasty                 Action/Plan: Discharge Planning: NCM spoke to pt and son, [name] at bedside. Plan is dc SNF rehab. CSW following for SNF placement.   PCP Ileana LaddWONG, FRANCIS P MD  Expected Discharge Date:  05/13/17               Expected Discharge Plan:  Skilled Nursing Facility  In-House Referral:  Clinical Social Work  Discharge planning Services  CM Consult  Post Acute Care Choice:  NA Choice offered to:  NA  DME Arranged:  N/A DME Agency:  NA  HH Arranged:  NA HH Agency:  NA  Status of Service:  Completed, signed off  If discussed at [employer] Length of Stay Meetings, dates discussed:    Additional Comments:  Elliot CousinShavis, Shawnee Gambone Ellen, RN 05/13/2017, 11:59 AM

## 2017-05-13 NOTE — Progress Notes (Addendum)
11:41 AM LCSW came to room to discharge patient and noticed patient currently has a Comptrollersitter.  Unable to determine why sitter was placed, but son reports (who is also at bedside) patient was trying to get up and not wait for someone to help, thus implying that patient may be a fall risk.  [facility] to see if the 24 hour policy could be waved and gave detail observation of patient and possible reason why she was placed with a tele-sitter.  LCSW is pending decision if patient can admit today.  Facility will call back and verify.  11:55 AM  Facility has waved 24 hour rule and agreeable to accept patient. Son in room and aware and agreeable.  LCSW covering for weekend:  Disposition SNF: [facility]  Patient medically stable for discharge. Call placed to facility and agreeable to accept Patient going to:  [facility]  Patient will transport by EMS. Will contact son and notify of discharge for today.  All paperwork sent through HUB.  No other needs.  DC to SNF today.  Angela EmoryHannah Jakiah Goree LCSW, MSW Clinical Social Work: Optician, dispensingystem Wide Float Coverage for :  [phone]

## 2017-05-13 NOTE — Clinical Social Work Placement (Signed)
   CLINICAL SOCIAL WORK PLACEMENT  NOTE  Date:  05/13/2017  Patient Details  Name: Angela [name] MRN: [id number] Date of Birth: [birth year]  Clinical Social Work is seeking post-discharge placement for this patient at the Skilled  Nursing Facility level of care (*CSW will initial, date and re-position this form in  chart as items are completed):  Yes   Patient/family provided with [facility] Clinical Social Work Department's list of facilities offering this level of care within the geographic area requested by the patient (or if unable, by the patient's family).  Yes   Patient/family informed of their freedom to choose among providers that offer the needed level of care, that participate in Medicare, Medicaid or managed care program needed by the patient, have an available bed and are willing to accept the patient.  Yes   Patient/family informed of [facility]'s ownership interest in [facility] and [facility], as well as of the fact that they are under no obligation to receive care at these facilities.  PASRR submitted to EDS on 05/11/17     PASRR number received on 05/11/17     Existing PASRR number confirmed on       FL2 transmitted to all facilities in geographic area requested by pt/family on 05/11/17     FL2 transmitted to all facilities within larger geographic area on       Patient informed that his/her managed care company has contracts with or will negotiate with certain facilities, including the following:        Yes   Patient/family informed of bed offers received.  [facility]     Physician recommends and patient [facility]    [facility] on 05/13/17.  Patient to be transferred to facility by EMS     Patient family notified on 05/13/17 of transfer.  Name of family member notified:  Son     PHYSICIAN Please sign FL2     Additional Comment:     _______________________________________________ Raye Sorrowoble, Rudolpho Claxton N, LCSW 05/13/2017, 11:15 AM

## 2017-05-13 NOTE — Progress Notes (Signed)
Discharge report called [facility] [employer]. Transported via [facility]. Condition unchanged. Melton Alarana A Wyvonne Carda, RN

## 2017-05-13 NOTE — Discharge Summary (Signed)
Physician Discharge Summary  Angela [name] JYN:[id number] DOB: 15-Dec-1933 DOA: 05/09/2017  PCP: Patient, No Pcp Per  Admit date: 05/09/2017 Discharge date: 05/13/2017  Admitted From: Home Disposition:  Nursing home  Recommendations for Outpatient Follow-up:  1. Follow up with nursing home provider at earliest convenience 2. Follow-up with orthopedics as scheduled 3. Fall precautions 4. Wound care as per orthopedics recommendations. Activity as per orthopedics/PT recommendations   Home Health: No  Equipment/Devices: None  Discharge Condition: Stable  CODE STATUS: Full Diet recommendation: Heart Healthy / Carb Modified  Brief/Interim Summary: 83 y.o.femalewith medical history significant of hypertesionpresented with a mechanical fall 3 days ago and was not able to get up. She was found to have right femoral neck fracture and slightly positive troponins. Orthopedics and cardiology were consulted.Patient had hip surgery done on 05/10/2017  Discharge Diagnoses:  Active Problems:   Fracture of femoral neck, right (HCC)   Essential hypertension   Hip fracture (HCC)   Heart murmur   Abnormal CXR   Pulsatile neck mass   Hyperglycemia   Elevated troponin   Leukocytosis   CAP (community acquired pneumonia)   Elevated TSH   Closed displaced fracture of right femoral neck (HCC)  1. Right femoral neck fracture:present on admission most likely secondary to fall - Status post right total hip arthroplasty, postoperative day #81 today. Orthopedics follow-up appreciated. Outpatient follow-up with orthopedics -PT/OT evaluation in the nursing home.  2. Mechanical fall: Fall precautions  3. Positive troponins: Probably due todemand ischemia. Cardiology evaluation appreciated. Echo shows ejection fraction of 65-70% with grade 1 diastolic dysfunction. No further workup indicated currently - Patient is chest pain-free  4. Hypertension: Monitor blood pressure. Amlodipine 10 mg  daily  5. Leukocytosis: Probably from community acquired pneumonia. Repeat a.m. Labs. Continue antibiotics.   6. Diabetes mellitus type 2 with hyperglycemia: This is a new diagnosis for the patient - Hemoglobin A1c is 7. - Dietary evaluation. Follow recommendations from diabetes coordinator - Start metformin 500 mg by mouth twice a day  7. Dehydration: Improved. Discontinue IV fluids  8. Community acquired pneumonia: Cultures negative so far. Status post Rocephin and Zithromax. Levaquin 500 mg daily for 5 more days.  9. Dyslipidemia: Continue statins  10. Hypothyroidism: Continue levothyroxine   11. Intermittent confusion: Secondary to delirium versus undiagnosed dementia; monitor mental status. Patient might need outpatient neurology evaluation  12. Hypokalemia: Improved. Outpatient follow-up  Discharge Instructions  Discharge Instructions    Call MD for:  difficulty breathing, headache or visual disturbances    Complete by:  As directed    Call MD for:  extreme fatigue    Complete by:  As directed    Call MD for:  persistant dizziness or light-headedness    Complete by:  As directed    Call MD for:  persistant nausea and vomiting    Complete by:  As directed    Call MD for:  severe uncontrolled pain    Complete by:  As directed    Call MD for:  temperature >100.4    Complete by:  As directed    Diet - low sodium heart healthy    Complete by:  As directed    Diet Carb Modified    Complete by:  As directed    Discharge instructions    Complete by:  As directed    Activity as per Orthopedics/PT recommendations Wound care as per Orthopedics recommendations Fall precautions   Increase activity slowly    Complete by:  As  directed      Allergies as of 05/13/2017      Reactions   Epinephrine Other (See Comments)   Other Other (See Comments)   Allergic to almost all RX-high powered antibiotics, anesthea used at dentist Pt states she can't breath if she eats  tomatoes. She states she is allergic to other vegetables but does not know the name of them.   Phenylephrine Other (See Comments)   DO NO DILATE WITH PHENYLEPHRINE      Medication List    TAKE these medications   aspirin 81 MG EC tablet Take 1 tablet (81 mg total) by mouth 2 (two) times daily with a meal.   atorvastatin 20 MG tablet Commonly known as:  LIPITOR Take 1 tablet (20 mg total) by mouth daily at 6 PM.   HYDROcodone-acetaminophen 5-325 MG tablet Commonly known as:  NORCO/VICODIN Take 1-2 tablets by mouth every 4 (four) hours as needed for moderate pain.   levofloxacin 500 MG tablet Commonly known as:  LEVAQUIN Take 1 tablet (500 mg total) by mouth at bedtime.   levothyroxine 50 MCG tablet Commonly known as:  SYNTHROID, LEVOTHROID Take 1 tablet (50 mcg total) by mouth daily before breakfast. Start taking on:  05/14/2017   metFORMIN 500 MG tablet Commonly known as:  GLUCOPHAGE Take 1 tablet (500 mg total) by mouth 2 (two) times daily with a meal.   polyethylene glycol packet Commonly known as:  MIRALAX / GLYCOLAX Take 17 g by mouth daily as needed for mild constipation.   prednisoLONE acetate 1 % ophthalmic suspension Commonly known as:  PRED FORTE Place 1 drop into the right eye 4 (four) times daily.       Contact information for follow-up providers    Swinteck, Arlys JohnBrian, MD. Schedule an appointment as soon as possible for a visit in 2 week(s).   Specialty:  Orthopedic Surgery Why:  For wound re-check Contact information: [address] [place] [address] [phone]            Contact information for after-discharge care    Destination    [facility] Follow up.   Specialty:  Skilled Nursing Facility Contact information: [address] [place] [id number] [phone]                 Allergies  Allergen Reactions  . Epinephrine Other (See Comments)  . Other Other (See Comments)    Allergic to  almost all RX-high powered antibiotics, anesthea used at dentist Pt states she can't breath if she eats tomatoes. She states she is allergic to other vegetables but does not know the name of them.  . Phenylephrine Other (See Comments)    DO NO DILATE WITH PHENYLEPHRINE    Consultations:  Orthopedics   Procedures/Studies: Dg Chest 2 View  Result Date: 05/09/2017 CLINICAL DATA:  Fall, fell on Sunday evening and was found today in prone position with shortening and rotation of the RIGHT leg, RIGHT hip pain radiating to knee EXAM: CHEST  2 VIEW COMPARISON:  09/23/2010 FINDINGS: Normal heart size, mediastinal contours, and pulmonary vascularity. Scattered atelectasis versus infiltrate in LEFT lung. Unable to exclude LEFT perihilar mass with this appearance. Tiny LEFT pleural effusion blunts the lateral costophrenic angle. Remaining lungs clear. No pneumothorax. Atherosclerotic calcification aorta. Bones demineralized. IMPRESSION: Scattered infiltrate versus atelectasis in LEFT lung, unable to exclude LEFT perihilar mass; when the patient's clinical condition permits, recommend follow-up CT chest with contrast to exclude neoplasm. Electronically Signed  By: Ulyses Southward M.D.   On: 05/09/2017 17:47   Ct Angio Neck W And/or Wo Contrast  Result Date: 05/09/2017 CLINICAL DATA:  Patient found down and side her house after multiple days. Recent fall EXAM: CT ANGIOGRAPHY NECK TECHNIQUE: Multidetector CT imaging of the neck was performed using the standard protocol during bolus administration of intravenous contrast. Multiplanar CT image reconstructions and MIPs were obtained to evaluate the vascular anatomy. Carotid stenosis measurements (when applicable) are obtained utilizing NASCET criteria, using the distal internal carotid diameter as the denominator. CONTRAST:  100 mL Isovue 370 COMPARISON:  None. FINDINGS: Aortic arch: There is atherosclerotic calcification of the aortic arch without stenosis. There is a  normal 3 vessel branching pattern. Both subclavian arteries are widely patent. Right carotid system: The right common carotid artery is normal. The right internal carotid artery is normal without stenosis. Left carotid system: The left common carotid artery and internal carotid artery are normal. Vertebral arteries: Vertebral arteries are mildly right-dominant. Both vertebral artery origins are normal. The remainder of the vertebral arteries are normal to their confluence at the basilar artery. Skeleton: Multilevel facet hypertrophy but no bony spinal canal stenosis. No lytic or blastic lesions. Other neck: Thyroid is normal. No cervical lymphadenopathy. Salivary glands are normal. No focal abnormality of the pharynx or larynx. Upper chest: Please see dedicated report for CTA of the chest. IMPRESSION: 1. No aneurysm, dissection or hemodynamically significant stenosis of the carotid or vertebral arteries. 2. Aortic atherosclerosis. 3. Please see dedicated report for CTA of the chest for findings below the thoracic inlet. Electronically Signed   By: Deatra Robinson M.D.   On: 05/09/2017 21:22   Ct Angio Chest Pe W Or Wo Contrast  Result Date: 05/09/2017 CLINICAL DATA:  Fall, abnormal chest x-ray EXAM: CT ANGIOGRAPHY CHEST WITH CONTRAST TECHNIQUE: Multidetector CT imaging of the chest was performed using the standard protocol during bolus administration of intravenous contrast. Multiplanar CT image reconstructions and MIPs were obtained to evaluate the vascular anatomy. CONTRAST:  100 mL Isovue 370 intravenous COMPARISON:  Radiograph 05/09/2017 FINDINGS: Cardiovascular: Satisfactory opacification of the pulmonary arteries to the segmental level. No evidence of pulmonary embolism. Non aneurysmal aorta. No dissection. Aortic atherosclerosis. Mild coronary artery calcification. Borderline heart size. No pericardial effusion. Mediastinum/Nodes: No enlarged mediastinal, hilar, or axillary lymph nodes. Thyroid gland,  trachea, and esophagus demonstrate no significant findings. Lungs/Pleura: Mild consolidation in the lingula. No pleural effusion or pneumothorax. Upper Abdomen: Partially visualized 2.5 cm low-density lesion in the left upper quadrant. Musculoskeletal: No chest wall abnormality. No acute or significant osseous findings. Review of the MIP images confirms the above findings. IMPRESSION: 1. Negative for acute pulmonary embolus or aortic dissection. No evidence for left hilar mass 2. Partially visualized 2.5 cm low-attenuation lesion left upper quadrant possibly a cyst from the kidney. Electronically Signed   By: Jasmine Pang M.D.   On: 05/09/2017 21:20   Pelvis Portable  Result Date: 05/10/2017 CLINICAL DATA:  Right anterior hip replacement; fluoro time 22 min; Post op right hip surgery today. EXAM: DG C-ARM 61-120 MIN-NO REPORT; PORTABLE PELVIS 1-2 VIEWS COMPARISON:  05/10/2017 FINDINGS: The patient has undergone right total hip arthroplasty. The femoral head component is well seated in the acetabular component on this frontal view. Soft tissue gas is identified in the right proximal thigh. Foley catheter is in place. There is deformity of the right inferior pubic ramus, favoring remote fracture. IMPRESSION: 1. Status post right total hip arthroplasty without adverse features. 2. Suspect  remote fracture of the right inferior pubic ramus. Electronically Signed   By: Norva Pavlov M.D.   On: 05/10/2017 17:06   Dg Chest Port 1 View  Result Date: 05/13/2017 CLINICAL DATA:  Dyspnea. EXAM: PORTABLE CHEST 1 VIEW COMPARISON:  Radiographs of May 09, 2017. FINDINGS: Stable cardiomediastinal silhouette. No pneumothorax is noted. Atherosclerosis of thoracic aorta is noted. No significant pleural effusion is noted. Right lung is clear. Left perihilar and basilar opacities are significantly improved compared to prior exam. Residual opacity remains in the left lower lobe concerning for residual atelectasis or scarring.  Bony thorax is unremarkable. IMPRESSION: Aortic atherosclerosis. Significantly improved left lung opacities compared to prior exam, with residual atelectasis or scarring seen in the left lung base. Electronically Signed   By: Lupita Raider, M.D.   On: 05/13/2017 07:53   Dg Knee Complete 4 Views Right  Result Date: 05/09/2017 CLINICAL DATA:  Fall, fell on Sunday evening and was found today in prone position with shortening and rotation of the RIGHT leg, RIGHT hip pain radiating to knee EXAM: RIGHT KNEE - COMPLETE 4+ VIEW COMPARISON:  None FINDINGS: Diffuse osseous demineralization. Medial compartment joint space narrowing spur formation. Mild chondrocalcinosis in medial compartment question CPPD. No acute fracture, dislocation, or bone destruction. No knee joint effusion. IMPRESSION: Osseous demineralization with mild degenerative changes and question CPPD RIGHT knee. No acute abnormalities. Please refer to report of RIGHT hip radiograph exam. Electronically Signed   By: Ulyses Southward M.D.   On: 05/09/2017 17:45   Dg C-arm 61-120 Min-no Report  Result Date: 05/10/2017 Fluoroscopy was utilized by the requesting physician.  No radiographic interpretation.   Dg Hip Operative Unilat W Or W/o Pelvis Right  Result Date: 05/10/2017 CLINICAL DATA:  83 year old female with a history of right hip replacement EXAM: OPERATIVE RIGHT HIP (WITH PELVIS IF PERFORMED)  VIEWS TECHNIQUE: Fluoroscopic spot image(s) were submitted for interpretation post-operatively. COMPARISON:  05/09/2017 FINDINGS: Multiple intraoperative fluoroscopic spot images demonstrate surgical changes of right hip arthroplasty for repair of femoral neck fracture. IMPRESSION: Intraoperative fluoroscopic spot images demonstrate surgical changes of right hip arthroplasty for repair of femoral neck fracture. Please refer to the dictated operative report for full details of intraoperative findings and procedure. Electronically Signed   By: Gilmer Mor  D.O.   On: 05/10/2017 15:47   Dg Hip Unilat W Or Wo Pelvis 2-3 Views Right  Result Date: 05/09/2017 CLINICAL DATA:  Fall EXAM: DG HIP (WITH OR WITHOUT PELVIS) 2-3V RIGHT COMPARISON:  None. FINDINGS: Old appearing right inferior pubic ramus fracture. Pubic symphysis is intact. No right femoral head dislocation. Foreshortened appearance of the right femoral neck with suspected transcervical fracture. IMPRESSION: 1. Foreshortened appearance of the right femoral neck with suspected transcervical fracture. 2. Old appearing right inferior pubic ramus fracture Electronically Signed   By: Jasmine Pang M.D.   On: 05/09/2017 17:45   Dg Femur, Min 2 Views Right  Result Date: 05/09/2017 CLINICAL DATA:  Fall, fell on Sunday evening and was found today in prone position with shortening and rotation of the RIGHT leg, RIGHT hip pain radiating to knee EXAM: RIGHT FEMUR 2 VIEWS COMPARISON:  None FINDINGS: Osseous mineralization. RIGHT hip imaged and reported separately. Remainder of RIGHT femur appears intact without fracture or dislocation. Knee joint alignment grossly normal. IMPRESSION: Osseous demineralization without acute abnormalities. Please refer to report of RIGHT hip radiographs. Electronically Signed   By: Ulyses Southward M.D.   On: 05/09/2017 17:44    Echo on 05/10/2017:  Ejection fraction of 65-70% with grade 1 diastolic dysfunction  Right total hip arthroplasty on 05/10/2017  Subjective: Patient seen and examined at bedside. She is awake but slightly confused. No overnight fever, vomiting  Discharge Exam: Vitals:   05/12/17 2003 05/13/17 0429  BP: (!) 159/68 (!) 148/77  Pulse: 86 84  Resp: 18 18  Temp: 98.6 F (37 C) 97.8 F (36.6 C)   Vitals:   05/12/17 0444 05/12/17 1456 05/12/17 2003 05/13/17 0429  BP: (!) 174/71 (!) 146/77 (!) 159/68 (!) 148/77  Pulse: 90 90 86 84  Resp: 20 18 18 18   Temp: 98.1 F (36.7 C)  98.6 F (37 C) 97.8 F (36.6 C)  TempSrc: Oral  Oral Oral  SpO2: 95% 93%  95% 97%  Weight:      Height:        General: Pt is alert, awake, With slight confusion Cardiovascular: RRR, rate controlled  Respiratory: Bilateral decreased breath sounds at bases Abdominal: Soft, NT, ND, bowel sounds + Extremities: Trace pedal edema, no cyanosis    The results of significant diagnostics from this hospitalization (including imaging, microbiology, ancillary and laboratory) are listed below for reference.     Microbiology: Recent Results (from the past 240 hour(s))  Surgical PCR screen     Status: None   Collection Time: 05/09/17 11:07 PM  Result Value Ref Range Status   MRSA, PCR NEGATIVE NEGATIVE Final   Staphylococcus aureus NEGATIVE NEGATIVE Final    Comment:        The Xpert SA Assay (FDA approved for NASAL specimens in patients over 14 years of age), is one component of a comprehensive surveillance program.  Test performance has been validated by [facility] for patients greater than or equal to 61 year old. It is not intended to diagnose infection nor to guide or monitor treatment.      Labs: BNP (last 3 results) No results for input(s): BNP in the last 8760 hours. Basic Metabolic Panel:  Recent Labs Lab 05/09/17 1553 05/10/17 0103 05/10/17 0358 05/11/17 0525 05/12/17 0540 05/13/17 0519  NA 136  --  139 141 141 140  K 4.2  --  3.6 3.5 3.3* 4.3  CL 101  --  104 108 107 107  CO2 26  --  26 25 26 24   GLUCOSE 271* 349* 208* 168* 176* 189*  BUN 38*  --  36* 24* 16 18  CREATININE 0.81  --  0.76 0.56 0.56 0.61  CALCIUM 9.4  --  9.3 8.3* 8.3* 8.4*  MG 2.7*  --  2.3 2.0  --   --   PHOS 3.5  --  2.7  --   --   --    Liver Function Tests:  Recent Labs Lab 05/10/17 0358  AST 27  ALT 24  ALKPHOS 59  BILITOT 0.6  PROT 6.5  ALBUMIN 3.4*   No results for input(s): LIPASE, AMYLASE in the last 168 hours. No results for input(s): AMMONIA in the last 168 hours. CBC:  Recent Labs Lab 05/09/17 1553 05/10/17 0358 05/11/17 0525  05/12/17 0540 05/13/17 0519  WBC 18.4* 15.9* 11.7* 12.2* 10.5  NEUTROABS  --   --  8.8*  --   --   HGB 15.5* 14.1 10.8* 10.2* 9.8*  HCT 44.6 41.5 33.2* 30.8* 30.3*  MCV 88.8 90.0 91.7 91.4 90.7  PLT 355 321 272 288 326   Cardiac Enzymes:  Recent Labs Lab 05/09/17 1553 05/09/17 2335 05/10/17 0358 05/10/17 0956  CKTOTAL  210  --   --   --   TROPONINI 0.16* 0.18* 0.13* 0.15*   BNP: Invalid input(s): POCBNP CBG:  Recent Labs Lab 05/12/17 1634 05/12/17 2002 05/13/17 0015 05/13/17 0427 05/13/17 0714  GLUCAP 179* 200* 178* 194* 166*   D-Dimer No results for input(s): DDIMER in the last 72 hours. Hgb A1c No results for input(s): HGBA1C in the last 72 hours. Lipid Profile No results for input(s): CHOL, HDL, LDLCALC, TRIG, CHOLHDL, LDLDIRECT in the last 72 hours. Thyroid function studies No results for input(s): TSH, T4TOTAL, T3FREE, THYROIDAB in the last 72 hours.  Invalid input(s): FREET3 Anemia work up No results for input(s): VITAMINB12, FOLATE, FERRITIN, TIBC, IRON, RETICCTPCT in the last 72 hours. Urinalysis    Component Value Date/Time   COLORURINE YELLOW 05/09/2017 1554   APPEARANCEUR CLOUDY (A) 05/09/2017 1554   LABSPEC 1.025 05/09/2017 1554   PHURINE 5.0 05/09/2017 1554   GLUCOSEU >=500 (A) 05/09/2017 1554   HGBUR SMALL (A) 05/09/2017 1554   BILIRUBINUR NEGATIVE 05/09/2017 1554   KETONESUR NEGATIVE 05/09/2017 1554   PROTEINUR 100 (A) 05/09/2017 1554   NITRITE NEGATIVE 05/09/2017 1554   LEUKOCYTESUR LARGE (A) 05/09/2017 1554   Sepsis Labs Invalid input(s): PROCALCITONIN,  WBC,  LACTICIDVEN Microbiology Recent Results (from the past 240 hour(s))  Surgical PCR screen     Status: None   Collection Time: 05/09/17 11:07 PM  Result Value Ref Range Status   MRSA, PCR NEGATIVE NEGATIVE Final   Staphylococcus aureus NEGATIVE NEGATIVE Final    Comment:        The Xpert SA Assay (FDA approved for NASAL specimens in patients over 77 years of age), is one  component of a comprehensive surveillance program.  Test performance has been validated by [facility] for patients greater than or equal to 77 year old. It is not intended to diagnose infection nor to guide or monitor treatment.      Time coordinating discharge: 40 minutes   SIGNED:   Glade Lloyd, MD  [facility] 05/13/2017, 11:00 AM Pager: [phone]  If 7PM-7AM, please contact night-coverage [email or web address] Password [email or web address]

## 2017-05-13 NOTE — Care Management Important Message (Signed)
Important Message  Patient Details  Name: Angela [name] MRN: [id number] Date of Birth: 07/26/1933   Medicare Important Message Given:  Yes    Elliot CousinShavis, Shoshana Johal Ellen, RN 05/13/2017, 11:45 AM

## 2017-05-15 ENCOUNTER — Non-Acute Institutional Stay (SKILLED_NURSING_FACILITY): Payer: Medicare Other | Admitting: Adult Health

## 2017-05-15 ENCOUNTER — Encounter: Payer: Self-pay | Admitting: Adult Health

## 2017-05-15 DIAGNOSIS — E119 Type 2 diabetes mellitus without complications: Secondary | ICD-10-CM | POA: Diagnosis not present

## 2017-05-15 DIAGNOSIS — S72001A Fracture of unspecified part of neck of right femur, initial encounter for closed fracture: Secondary | ICD-10-CM | POA: Diagnosis not present

## 2017-05-15 DIAGNOSIS — I10 Essential (primary) hypertension: Secondary | ICD-10-CM | POA: Diagnosis not present

## 2017-05-15 DIAGNOSIS — J189 Pneumonia, unspecified organism: Secondary | ICD-10-CM

## 2017-05-15 DIAGNOSIS — E039 Hypothyroidism, unspecified: Secondary | ICD-10-CM | POA: Diagnosis not present

## 2017-05-15 NOTE — Progress Notes (Signed)
Location:   [facility] Room Number: 106-P Place of Service:  SNF (31)   CODE STATUS: full code   Allergies  Allergen Reactions  . Epinephrine Other (See Comments)  . Other Other (See Comments)    Allergic to almost all RX-high powered antibiotics, anesthea used at dentist Pt states she can't breath if she eats tomatoes. She states she is allergic to other vegetables but does not know the name of them.  . Phenylephrine Other (See Comments)    DO NO DILATE WITH PHENYLEPHRINE    Chief Complaint  Patient presents with  . Hospitalization Follow-up    HPI:  She has been hospitalized after a fall home and had a right femoral neck fracture. She apparently waited 3 days before going to the hospital she is here for short term rehab with her goal to return back home. She does have some right hip pain. She does not want to take narcotic pain relievers we did discuss her pain management and she is agreeable to take tylenol on a routine basis.    Past Medical History:  Diagnosis Date  . Allergy   . Cataract   . Hypertension     Past Surgical History:  Procedure Laterality Date  . FRACTURE SURGERY    . HERNIA REPAIR    . HIP ARTHROPLASTY Right 05/10/2017   Procedure: ARTHROPLASTY  ANTERIOR APPROACH RIGHT HIP;  Surgeon: Samson Frederic, MD;  Location: [facility];  Service: Orthopedics;  Laterality: Right;    Social History   Social History  . Marital status: Divorced    Spouse name: N/A  . Number of children: N/A  . Years of education: N/A   Occupational History  . Not on file.   Social History Main Topics  . Smoking status: Never Smoker  . Smokeless tobacco: Never Used  . Alcohol use No  . Drug use: No  . Sexual activity: Not on file   Other Topics Concern  . Not on file   Social History Narrative  . No narrative on file   Family History  Problem Relation Age of Onset  . Stroke Mother   . Diabetes Sister   . CAD Neg Hx   . Hypertension Neg Hx   .  Cancer Neg Hx       VITAL SIGNS BP (!) 144/61   Pulse 91   Temp 98 F (36.7 C) (Oral)   Resp 18   Ht 5\' 2"  (1.575 m)   Wt 153 lb 12.8 oz (69.8 kg)   SpO2 96%   BMI 28.13 kg/m   Patient's Medications  New Prescriptions   No medications on file  Previous Medications   ASPIRIN EC 81 MG EC TABLET    Take 1 tablet (81 mg total) by mouth 2 (two) times daily with a meal.   ATORVASTATIN (LIPITOR) 20 MG TABLET    Take 1 tablet (20 mg total) by mouth daily at 6 PM.   HYDROCODONE-ACETAMINOPHEN (NORCO/VICODIN) 5-325 MG TABLET    Take 1-2 tablets by mouth every 4 (four) hours as needed for moderate pain.   LEVOFLOXACIN (LEVAQUIN) 500 MG TABLET    Take 1 tablet (500 mg total) by mouth at bedtime.   LEVOTHYROXINE (SYNTHROID, LEVOTHROID) 50 MCG TABLET    Take 1 tablet (50 mcg total) by mouth daily before breakfast.   METFORMIN (GLUCOPHAGE) 500 MG TABLET    Take 1 tablet (500 mg total) by mouth 2 (two) times daily with a meal.  POLYETHYLENE GLYCOL (MIRALAX / GLYCOLAX) PACKET    Take 17 g by mouth daily as needed for mild constipation.   PREDNISOLONE ACETATE (PRED FORTE) 1 % OPHTHALMIC SUSPENSION    Place 1 drop into the right eye 4 (four) times daily.  Modified Medications   No medications on file  Discontinued Medications   No medications on file     SIGNIFICANT DIAGNOSTIC EXAMS  05-09-17: ct angio of neck: 1. No aneurysm, dissection or hemodynamically significant stenosis of the carotid or vertebral arteries. 2. Aortic atherosclerosis. 3. Please see dedicated report for CTA of the chest for findings below the thoracic inlet.  05-09-17: ct angio of chest: 1. Negative for acute pulmonary embolus or aortic dissection. No evidence for left hilar mass 2. Partially visualized 2.5 cm low-attenuation lesion left upper quadrant possibly a cyst from the kidney.  05-09-17: chest x-ray: Scattered infiltrate versus atelectasis in LEFT lung, unable to exclude LEFT perihilar mass; when the  patient's clinical condition permits, recommend follow-up CT chest with contrast to exclude Neoplasm.  05-09-17: right hip and pelvic x-ray: 1. Foreshortened appearance of the right femoral neck with suspected transcervical fracture. 2. Old appearing right inferior pubic ramus fracture   05-10-17: 2-d echo: - Vigorous LV systolic function; mild LVH; mild diastolic   dysfunction with elevated LV filling pressure; sclerotic aortic   valve with trace AI; mild MR; moderate LAE.  05-10-17: pelvic x-ray: 1. Status post right total hip arthroplasty without adverse features. 2. Suspect remote fracture of the right inferior pubic ramus.    LABS REVIEWED:   05-09-17: wbc 18.4; hgb 15.5; hct 44.6 ;mcv 88.8 plt 355; glucose 271; bun 38; creat 0.81; k+ 4.2; na++ 138; ca 9.4; mag 2.7; phos 3.5; tsh 5 .657; free T 4: 0.96 05-10-17: wbc 15.9; hgb 14.1; hct 41.5; mcv 90.0; plt 321; glucose 208; bun 36; creat 0.76; k+ 3.6; na++139; ca 9.3; liver normal albumin 3.4; tsh 8.315; vit D 32.8; chol 250; ldl 143; trig 125; hdl 82 1-61-09: wbc 10.5; hgb 9.8; hct30.3; mcv 90.7; plt 326; glucose 189; bun 18; creat 0.61; k+ 4.3; na++ 140; ca 8.4   Review of Systems  Constitutional: Negative for malaise/fatigue.  Respiratory: Negative for cough and shortness of breath.   Cardiovascular: Negative for chest pain, palpitations and leg swelling.  Gastrointestinal: Negative for abdominal pain, constipation and heartburn.  Musculoskeletal: Positive for joint pain. Negative for back pain and myalgias.       Does have right hip pain   Skin: Negative.   Neurological: Negative for dizziness.  Psychiatric/Behavioral: The patient is not nervous/anxious.       Physical Exam  Constitutional: She is oriented to person, place, and time. No distress.  Eyes: Conjunctivae are normal.  Neck: Neck supple. No JVD present. No thyromegaly present.  Cardiovascular: Normal rate, regular rhythm and intact distal pulses.   Respiratory:  Effort normal and breath sounds normal. No respiratory distress. She has no wheezes.  GI: Soft. Bowel sounds are normal. She exhibits no distension. There is no tenderness.  Musculoskeletal: She exhibits no edema.  Able to move all extremities  Is status post right femoral neck fracture  Lymphadenopathy:    She has no cervical adenopathy.  Neurological: She is alert and oriented to person, place, and time.  Skin: Skin is warm and dry. She is not diaphoretic.  Right hip incision line without signs of infection   Psychiatric: She has a normal mood and affect.      ASSESSMENT/ PLAN:  1. Hypertension: b/p 144/61; will continue asa 81 mg daily   2. Pneumonia: will complete her levaquin 500 mg daily for total of 5 days will monitor   3. Dyslipidemia: ldl 143 will continue lipitor 20 mg daily   4. Hypothyroidism: tsh 8.315; will continue synthroid 50 mcg daily   5. Diabetes: new diagnosis: hgb a1c 7.0; will continue metformin 500 mg twice daily   6. Constipation: will continue miralax daily as needed  7. Closed displaced fracture of right femoral neck: will follow up orthopedics as indicted will continue therapy as directed; will begin tylenol 650 mg three times daily on a routine basis.  Will stop vicodin at this time.   Will need follow up with orthopedics    Time spent with patient 5-   minutes >50% time spent counseling; reviewing medical record; tests; labs; and developing future plan of care   MD is aware of resident's narcotic use and is in agreement with current plan of care. We will attempt to wean resident as apropriate   Synthia Innocenteborah Asja Frommer NP [facility]  Contact [phone] Monday through Friday 8am- 5pm  After hours call [phone]

## 2017-05-16 ENCOUNTER — Non-Acute Institutional Stay (SKILLED_NURSING_FACILITY): Payer: Medicare Other | Admitting: Internal Medicine

## 2017-05-16 ENCOUNTER — Encounter: Payer: Self-pay | Admitting: Internal Medicine

## 2017-05-16 DIAGNOSIS — E119 Type 2 diabetes mellitus without complications: Secondary | ICD-10-CM | POA: Diagnosis not present

## 2017-05-16 DIAGNOSIS — S72001A Fracture of unspecified part of neck of right femur, initial encounter for closed fracture: Secondary | ICD-10-CM | POA: Diagnosis not present

## 2017-05-16 DIAGNOSIS — R7989 Other specified abnormal findings of blood chemistry: Secondary | ICD-10-CM

## 2017-05-16 DIAGNOSIS — I10 Essential (primary) hypertension: Secondary | ICD-10-CM | POA: Diagnosis not present

## 2017-05-16 DIAGNOSIS — J181 Lobar pneumonia, unspecified organism: Secondary | ICD-10-CM

## 2017-05-16 DIAGNOSIS — R778 Other specified abnormalities of plasma proteins: Secondary | ICD-10-CM

## 2017-05-16 DIAGNOSIS — R748 Abnormal levels of other serum enzymes: Secondary | ICD-10-CM

## 2017-05-16 DIAGNOSIS — E039 Hypothyroidism, unspecified: Secondary | ICD-10-CM

## 2017-05-16 DIAGNOSIS — J189 Pneumonia, unspecified organism: Secondary | ICD-10-CM

## 2017-05-16 NOTE — Progress Notes (Signed)
Location:  [facility] Room Number: 106-P Place of Service:  SNF [phone]) Provider:  Orson Eva, MD  Patient Care Team: Ileana Ladd, MD as PCP - General (Family Medicine)  Extended Emergency Contact Information Primary Emergency Contact: Scrima,[name]  [place] Mobile Phone: [phone] Relation: None  Code Status:  Full Code Goals of care: Advanced Directive information Advanced Directives 05/09/2017  Does Patient Have a Medical Advance Directive? No  Would patient like information on creating a medical advance directive? No - Patient declined     Chief Complaint  Patient presents with  . New Admit To SNF    New Admission Visit     HPI:  Pt is a 83 y.o. female seen today for admission to SNF for therapy after undergoing Right Total Hip Arthroplasty.  Patient has h/o Hypertension and was very independent and lived by herself at home.  She fell at home and was unable to walk . She came to ED after 3 days after her Neighbors called EMS. There she was found to have right hip fracture. She underwent Right hip Arthroplasty on 05/10/17 She also was found to have Community Acquired pneumonia and was treated with Antibiotics. She also had high troponin's thought to be due to Demand Ischemia She also had some confusion post op thought to be Delirium. Also had new diagnosis of Diabetes Mellitus and was started on Metformin. She also was started on Synthroid and Lipitor. Patient is doing well in facility. She is walking with the walker. Pain is controlled on Tylenol. She is having some pain mostly at night. She also has noticed some swelling and tightness in her Right Lower leg.  Her appetite is good. She is not sure if she will be able to go home as she does not have any family which lives close by. Patient has been very non compliant with her Meds in past and says she doesn't believe in these Medicines.  Past  Medical History:  Diagnosis Date  . Allergy   . Cataract   . Hypertension    Past Surgical History:  Procedure Laterality Date  . FRACTURE SURGERY    . HERNIA REPAIR    . HIP ARTHROPLASTY Right 05/10/2017   Procedure: ARTHROPLASTY  ANTERIOR APPROACH RIGHT HIP;  Surgeon: Samson Frederic, MD;  Location: [facility];  Service: Orthopedics;  Laterality: Right;    Allergies  Allergen Reactions  . Epinephrine Other (See Comments)  . Other Other (See Comments)    Allergic to almost all RX-high powered antibiotics, anesthea used at dentist Pt states she can't breath if she eats tomatoes. She states she is allergic to other vegetables but does not know the name of them.  . Phenylephrine Other (See Comments)    DO NO DILATE WITH PHENYLEPHRINE    Outpatient Encounter Prescriptions as of 05/16/2017  Medication Sig  . acetaminophen (TYLENOL) 325 MG tablet Take 650 mg by mouth 3 (three) times daily.  Marland Kitchen aspirin EC 81 MG EC tablet Take 1 tablet (81 mg total) by mouth 2 (two) times daily with a meal.  . atorvastatin (LIPITOR) 20 MG tablet Take 1 tablet (20 mg total) by mouth daily at 6 PM.  . levofloxacin (LEVAQUIN) 500 MG tablet Take 1 tablet (500 mg total) by mouth at bedtime.  Marland Kitchen levothyroxine (SYNTHROID, LEVOTHROID) 50 MCG tablet Take 1 tablet (50 mcg total) by mouth daily before breakfast.  . metFORMIN (GLUCOPHAGE) 500 MG tablet Take  1 tablet (500 mg total) by mouth 2 (two) times daily with a meal.  . polyethylene glycol (MIRALAX / GLYCOLAX) packet Take 17 g by mouth daily as needed for mild constipation.  . prednisoLONE acetate (PRED FORTE) 1 % ophthalmic suspension Place 1 drop into the right eye 4 (four) times daily.  . [DISCONTINUED] HYDROcodone-acetaminophen (NORCO/VICODIN) 5-325 MG tablet Take 1-2 tablets by mouth every 4 (four) hours as needed for moderate pain.   No facility-administered encounter medications on file as of 05/16/2017.     Review of Systems  Constitutional: Negative.     HENT: Positive for congestion. Negative for drooling, postnasal drip, rhinorrhea and sinus pain.   Respiratory: Negative.   Cardiovascular: Positive for leg swelling. Negative for chest pain and palpitations.  Gastrointestinal: Positive for constipation. Negative for abdominal distention, abdominal pain, diarrhea and nausea.  Genitourinary: Negative.   Musculoskeletal: Negative.   Skin: Negative.   Neurological: Negative.   Psychiatric/Behavioral: Negative.     Immunization History  Administered Date(s) Administered  . Tdap 06/24/2015   Pertinent  Health Maintenance Due  Topic Date Due  . FOOT EXAM  02/13/1943  . OPHTHALMOLOGY EXAM  02/13/1943  . URINE MICROALBUMIN  02/13/1943  . DEXA SCAN  02/14/2000  . PNA vac Low Risk Adult (1 of 2 - PCV13) 02/14/2000  . INFLUENZA VACCINE  07/18/2017  . HEMOGLOBIN A1C  11/09/2017   Fall Risk  06/24/2015  Falls in the past year? No   Functional Status Survey:    Vitals:   05/16/17 0946  BP: (!) 146/68  Pulse: 85  Resp: 16  Temp: 98.5 F (36.9 C)  TempSrc: Oral  SpO2: 95%  Weight: 153 lb 12.8 oz (69.8 kg)  Height: 5\' 2"  (1.575 m)   Body mass index is 28.13 kg/m. Physical Exam  Constitutional: She is oriented to person, place, and time. She appears well-developed and well-nourished.  HENT:  Head: Normocephalic.  Mouth/Throat: Oropharynx is clear and moist.  Bad oral hygeine  Eyes: Pupils are equal, round, and reactive to light.  Neck: Neck supple.  Cardiovascular: Normal rate, regular rhythm and normal heart sounds.   No murmur heard. Pulmonary/Chest: Effort normal and breath sounds normal. No respiratory distress. She has no wheezes. She has no rales.  Abdominal: Soft. Bowel sounds are normal. She exhibits no distension. There is no rebound.  Musculoskeletal:  Moderate edema in Both Extremities with right more then left. No calf tenderness  Neurological: She is alert and oriented to person, place, and time.  No focal  deficits.  Skin: Skin is warm and dry.  Psychiatric: She has a normal mood and affect. Her behavior is normal. Thought content normal.    Labs reviewed:  Recent Labs  05/09/17 1553  05/10/17 0358 05/11/17 0525 05/12/17 0540 05/13/17 0519  NA 136  --  139 141 141 140  K 4.2  --  3.6 3.5 3.3* 4.3  CL 101  --  104 108 107 107  CO2 26  --  26 25 26 24   GLUCOSE 271*  < > 208* 168* 176* 189*  BUN 38*  --  36* 24* 16 18  CREATININE 0.81  --  0.76 0.56 0.56 0.61  CALCIUM 9.4  --  9.3 8.3* 8.3* 8.4*  MG 2.7*  --  2.3 2.0  --   --   PHOS 3.5  --  2.7  --   --   --   < > = values in this interval not displayed.  Recent Labs  05/10/17 0358  AST 27  ALT 24  ALKPHOS 59  BILITOT 0.6  PROT 6.5  ALBUMIN 3.4*    Recent Labs  05/11/17 0525 05/12/17 0540 05/13/17 0519  WBC 11.7* 12.2* 10.5  NEUTROABS 8.8*  --   --   HGB 10.8* 10.2* 9.8*  HCT 33.2* 30.8* 30.3*  MCV 91.7 91.4 90.7  PLT 272 288 326   Lab Results  Component Value Date   TSH 8.315 (H) 05/10/2017   Lab Results  Component Value Date   HGBA1C 7.0 (H) 05/09/2017   Lab Results  Component Value Date   CHOL 250 (H) 05/10/2017   HDL 82 05/10/2017   LDLCALC 143 (H) 05/10/2017   TRIG 125 05/10/2017   CHOLHDL 3.0 05/10/2017    Significant Diagnostic Results in last 30 days:  Dg Chest 2 View  Result Date: 05/09/2017 CLINICAL DATA:  Fall, fell on Sunday evening and was found today in prone position with shortening and rotation of the RIGHT leg, RIGHT hip pain radiating to knee EXAM: CHEST  2 VIEW COMPARISON:  09/23/2010 FINDINGS: Normal heart size, mediastinal contours, and pulmonary vascularity. Scattered atelectasis versus infiltrate in LEFT lung. Unable to exclude LEFT perihilar mass with this appearance. Tiny LEFT pleural effusion blunts the lateral costophrenic angle. Remaining lungs clear. No pneumothorax. Atherosclerotic calcification aorta. Bones demineralized. IMPRESSION: Scattered infiltrate versus  atelectasis in LEFT lung, unable to exclude LEFT perihilar mass; when the patient's clinical condition permits, recommend follow-up CT chest with contrast to exclude neoplasm. Electronically Signed   By: Ulyses Southward M.D.   On: 05/09/2017 17:47   Ct Angio Neck W And/or Wo Contrast  Result Date: 05/09/2017 CLINICAL DATA:  Patient found down and side her house after multiple days. Recent fall EXAM: CT ANGIOGRAPHY NECK TECHNIQUE: Multidetector CT imaging of the neck was performed using the standard protocol during bolus administration of intravenous contrast. Multiplanar CT image reconstructions and MIPs were obtained to evaluate the vascular anatomy. Carotid stenosis measurements (when applicable) are obtained utilizing NASCET criteria, using the distal internal carotid diameter as the denominator. CONTRAST:  100 mL Isovue 370 COMPARISON:  None. FINDINGS: Aortic arch: There is atherosclerotic calcification of the aortic arch without stenosis. There is a normal 3 vessel branching pattern. Both subclavian arteries are widely patent. Right carotid system: The right common carotid artery is normal. The right internal carotid artery is normal without stenosis. Left carotid system: The left common carotid artery and internal carotid artery are normal. Vertebral arteries: Vertebral arteries are mildly right-dominant. Both vertebral artery origins are normal. The remainder of the vertebral arteries are normal to their confluence at the basilar artery. Skeleton: Multilevel facet hypertrophy but no bony spinal canal stenosis. No lytic or blastic lesions. Other neck: Thyroid is normal. No cervical lymphadenopathy. Salivary glands are normal. No focal abnormality of the pharynx or larynx. Upper chest: Please see dedicated report for CTA of the chest. IMPRESSION: 1. No aneurysm, dissection or hemodynamically significant stenosis of the carotid or vertebral arteries. 2. Aortic atherosclerosis. 3. Please see dedicated report for  CTA of the chest for findings below the thoracic inlet. Electronically Signed   By: Deatra Robinson M.D.   On: 05/09/2017 21:22   Ct Angio Chest Pe W Or Wo Contrast  Result Date: 05/09/2017 CLINICAL DATA:  Fall, abnormal chest x-ray EXAM: CT ANGIOGRAPHY CHEST WITH CONTRAST TECHNIQUE: Multidetector CT imaging of the chest was performed using the standard protocol during bolus administration of intravenous contrast. Multiplanar CT image  reconstructions and MIPs were obtained to evaluate the vascular anatomy. CONTRAST:  100 mL Isovue 370 intravenous COMPARISON:  Radiograph 05/09/2017 FINDINGS: Cardiovascular: Satisfactory opacification of the pulmonary arteries to the segmental level. No evidence of pulmonary embolism. Non aneurysmal aorta. No dissection. Aortic atherosclerosis. Mild coronary artery calcification. Borderline heart size. No pericardial effusion. Mediastinum/Nodes: No enlarged mediastinal, hilar, or axillary lymph nodes. Thyroid gland, trachea, and esophagus demonstrate no significant findings. Lungs/Pleura: Mild consolidation in the lingula. No pleural effusion or pneumothorax. Upper Abdomen: Partially visualized 2.5 cm low-density lesion in the left upper quadrant. Musculoskeletal: No chest wall abnormality. No acute or significant osseous findings. Review of the MIP images confirms the above findings. IMPRESSION: 1. Negative for acute pulmonary embolus or aortic dissection. No evidence for left hilar mass 2. Partially visualized 2.5 cm low-attenuation lesion left upper quadrant possibly a cyst from the kidney. Electronically Signed   By: Jasmine PangKim  Fujinaga M.D.   On: 05/09/2017 21:20   Pelvis Portable  Result Date: 05/10/2017 CLINICAL DATA:  Right anterior hip replacement; fluoro time 22 min; Post op right hip surgery today. EXAM: DG C-ARM 61-120 MIN-NO REPORT; PORTABLE PELVIS 1-2 VIEWS COMPARISON:  05/10/2017 FINDINGS: The patient has undergone right total hip arthroplasty. The femoral head  component is well seated in the acetabular component on this frontal view. Soft tissue gas is identified in the right proximal thigh. Foley catheter is in place. There is deformity of the right inferior pubic ramus, favoring remote fracture. IMPRESSION: 1. Status post right total hip arthroplasty without adverse features. 2. Suspect remote fracture of the right inferior pubic ramus. Electronically Signed   By: Norva PavlovElizabeth  Brown M.D.   On: 05/10/2017 17:06   Dg Chest Port 1 View  Result Date: 05/13/2017 CLINICAL DATA:  Dyspnea. EXAM: PORTABLE CHEST 1 VIEW COMPARISON:  Radiographs of May 09, 2017. FINDINGS: Stable cardiomediastinal silhouette. No pneumothorax is noted. Atherosclerosis of thoracic aorta is noted. No significant pleural effusion is noted. Right lung is clear. Left perihilar and basilar opacities are significantly improved compared to prior exam. Residual opacity remains in the left lower lobe concerning for residual atelectasis or scarring. Bony thorax is unremarkable. IMPRESSION: Aortic atherosclerosis. Significantly improved left lung opacities compared to prior exam, with residual atelectasis or scarring seen in the left lung base. Electronically Signed   By: Lupita RaiderJames  Green Jr, M.D.   On: 05/13/2017 07:53   Dg Knee Complete 4 Views Right  Result Date: 05/09/2017 CLINICAL DATA:  Fall, fell on Sunday evening and was found today in prone position with shortening and rotation of the RIGHT leg, RIGHT hip pain radiating to knee EXAM: RIGHT KNEE - COMPLETE 4+ VIEW COMPARISON:  None FINDINGS: Diffuse osseous demineralization. Medial compartment joint space narrowing spur formation. Mild chondrocalcinosis in medial compartment question CPPD. No acute fracture, dislocation, or bone destruction. No knee joint effusion. IMPRESSION: Osseous demineralization with mild degenerative changes and question CPPD RIGHT knee. No acute abnormalities. Please refer to report of RIGHT hip radiograph exam. Electronically  Signed   By: Ulyses SouthwardMark  Boles M.D.   On: 05/09/2017 17:45   Dg C-arm 61-120 Min-no Report  Result Date: 05/10/2017 Fluoroscopy was utilized by the requesting physician.  No radiographic interpretation.   Dg Hip Operative Unilat W Or W/o Pelvis Right  Result Date: 05/10/2017 CLINICAL DATA:  83 year old female with a history of right hip replacement EXAM: OPERATIVE RIGHT HIP (WITH PELVIS IF PERFORMED)  VIEWS TECHNIQUE: Fluoroscopic spot image(s) were submitted for interpretation post-operatively. COMPARISON:  05/09/2017 FINDINGS: Multiple intraoperative  fluoroscopic spot images demonstrate surgical changes of right hip arthroplasty for repair of femoral neck fracture. IMPRESSION: Intraoperative fluoroscopic spot images demonstrate surgical changes of right hip arthroplasty for repair of femoral neck fracture. Please refer to the dictated operative report for full details of intraoperative findings and procedure. Electronically Signed   By: Gilmer Mor D.O.   On: 05/10/2017 15:47   Dg Hip Unilat W Or Wo Pelvis 2-3 Views Right  Result Date: 05/09/2017 CLINICAL DATA:  Fall EXAM: DG HIP (WITH OR WITHOUT PELVIS) 2-3V RIGHT COMPARISON:  None. FINDINGS: Old appearing right inferior pubic ramus fracture. Pubic symphysis is intact. No right femoral head dislocation. Foreshortened appearance of the right femoral neck with suspected transcervical fracture. IMPRESSION: 1. Foreshortened appearance of the right femoral neck with suspected transcervical fracture. 2. Old appearing right inferior pubic ramus fracture Electronically Signed   By: Jasmine Pang M.D.   On: 05/09/2017 17:45   Dg Femur, Min 2 Views Right  Result Date: 05/09/2017 CLINICAL DATA:  Fall, fell on Sunday evening and was found today in prone position with shortening and rotation of the RIGHT leg, RIGHT hip pain radiating to knee EXAM: RIGHT FEMUR 2 VIEWS COMPARISON:  None FINDINGS: Osseous mineralization. RIGHT hip imaged and reported separately.  Remainder of RIGHT femur appears intact without fracture or dislocation. Knee joint alignment grossly normal. IMPRESSION: Osseous demineralization without acute abnormalities. Please refer to report of RIGHT hip radiographs. Electronically Signed   By: Ulyses Southward M.D.   On: 05/09/2017 17:44    Assessment/Plan Closed displaced fracture of right femoral neck  S/P Right Total Hip Arthroplasty D/W patient . Will start her on some Norco at night to help with her pain control Patient doing well with therapy Needs follow up with her ortho.  Community acquired pneumonia of left lower lobe of lung Patient on Levaquin finishing course. Staying asymptomatic with no SOB or cough.  Essential hypertension Patient was started on Norvasc in the hospital but somewhat she is not on this here. Will Start her on 5 mg of amlodipine.   type 2 diabetes mellitus  BS are running more then 200 in facility. This is her new diagnosis Will start her on Janumet BID. Continue to follow her BS Also sliding scale for spikes.  Hypothyroidism,  Started on Synthroid LE swelling It can be due to Diastolic failure as per ECHO But will  get dopplers to rule out DVT as she was on the floor for long time.  Elevated troponin No further work up right now per Cardiology Patient asymptomatic.  Hyperlipidemia On Lipitor. Continue. Patient is very reluctant about these meds. Not sure how long she will take them at home Disposition She does not have family in town. She is thinking of Assisted living facility.   Family/ staff Communication:   Labs/tests ordered:  Repeat CBC and CMP Dopplers of LE Total time spent in this patient care encounter was 45_ minutes; greater than 50% of the visit spent counseling patient and coordinating care for problems addressed at this encounter.

## 2018-03-01 IMAGING — CT CT ANGIO NECK
2 of 18 series · 4 of 33 positions shown, 5 images · IV contrast (isovue)
Comparison: None.

CLINICAL DATA: Patient found down and side her house after multiple
days. Recent fall

EXAM:
CT ANGIOGRAPHY NECK
TECHNIQUE: Multidetector CT imaging of the neck was performed using the
standard protocol during bolus administration of intravenous
contrast. Multiplanar CT image reconstructions and MIPs were
obtained to evaluate the vascular anatomy. Carotid stenosis
measurements (when applicable) are obtained utilizing NASCET
criteria, using the distal internal carotid diameter as the
denominator.
CONTRAST:  100 mL Isovue 370

[Series 9: coronal thins · sagittal · 0.43mm/px · 1 of 221 slices shown]
[im 74/221  bone]
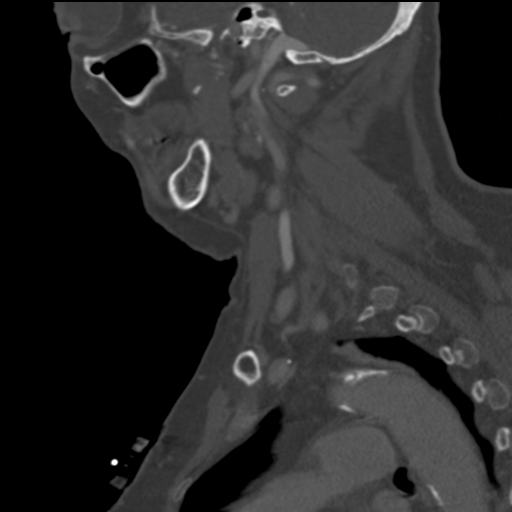

[Series 14: thins for (person_name) recon · axial · 0.59mm/px · z∈[-438,-205]mm · 3 of 583 slices shown, 4 images]
[im 1/583  soft-tissue]
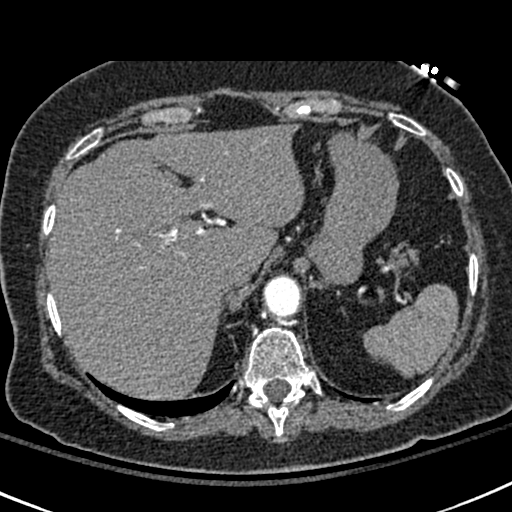
[im 1/583  bone]
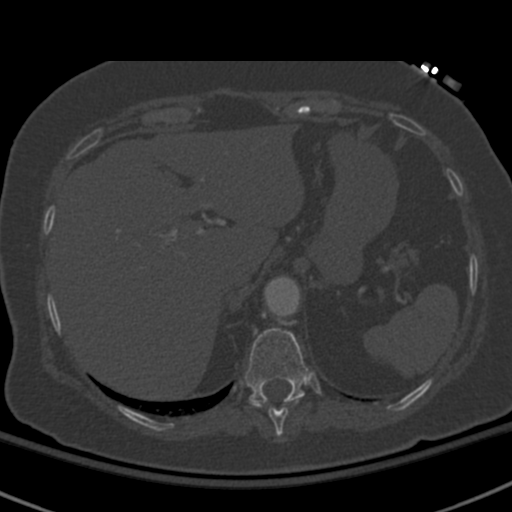
[im 292/583  soft-tissue]
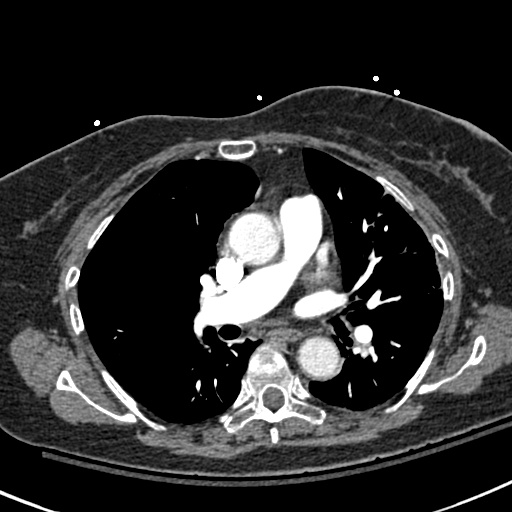
[im 583/583  soft-tissue]
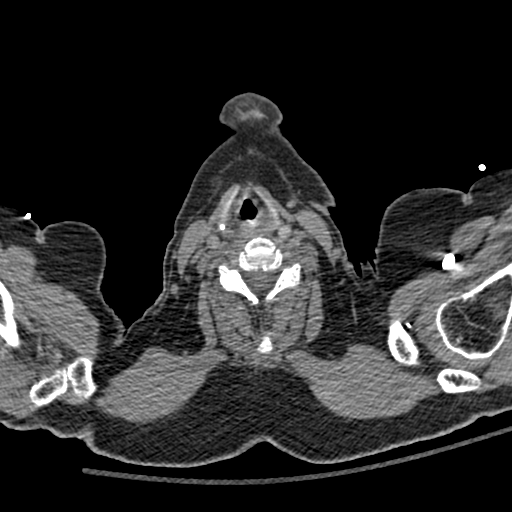

[4 of 33 positions shown; findings below may reference images not displayed]

FINDINGS: Aortic arch: There is atherosclerotic calcification of the aortic
arch without stenosis. There is a normal 3 vessel branching pattern.
Both subclavian arteries are widely patent.

Right carotid system: The right common carotid artery is normal. The
right internal carotid artery is normal without stenosis.

Left carotid system: The left common carotid artery and internal
carotid artery are normal.

Vertebral arteries: Vertebral arteries are mildly right-dominant.
Both vertebral artery origins are normal. The remainder of the
vertebral arteries are normal to their confluence at the basilar
artery.

Skeleton: Multilevel facet hypertrophy but no bony spinal canal
stenosis. No lytic or blastic lesions.

Other neck: Thyroid is normal. No cervical lymphadenopathy. Salivary
glands are normal. No focal abnormality of the pharynx or larynx.

Upper chest: Please see dedicated report for CTA of the chest.
IMPRESSION: 1. No aneurysm, dissection or hemodynamically significant stenosis
of the carotid or vertebral arteries.
2. Aortic atherosclerosis.
3. Please see dedicated report for CTA of the chest for findings
below the thoracic inlet.

## 2018-03-02 IMAGING — RF DG HIP (WITH PELVIS) OPERATIVE*R*
1 series · 6 of 6 positions shown · non-contrast
Comparison: 05/09/2017

CLINICAL DATA: 84-year-old female with a history of right hip
replacement

EXAM:
OPERATIVE RIGHT HIP (WITH PELVIS IF PERFORMED)  VIEWS
TECHNIQUE: Fluoroscopic spot image(s) were submitted for interpretation
post-operatively.

[Series 1: run · 6 of 6 slices shown]
[im 1/6]
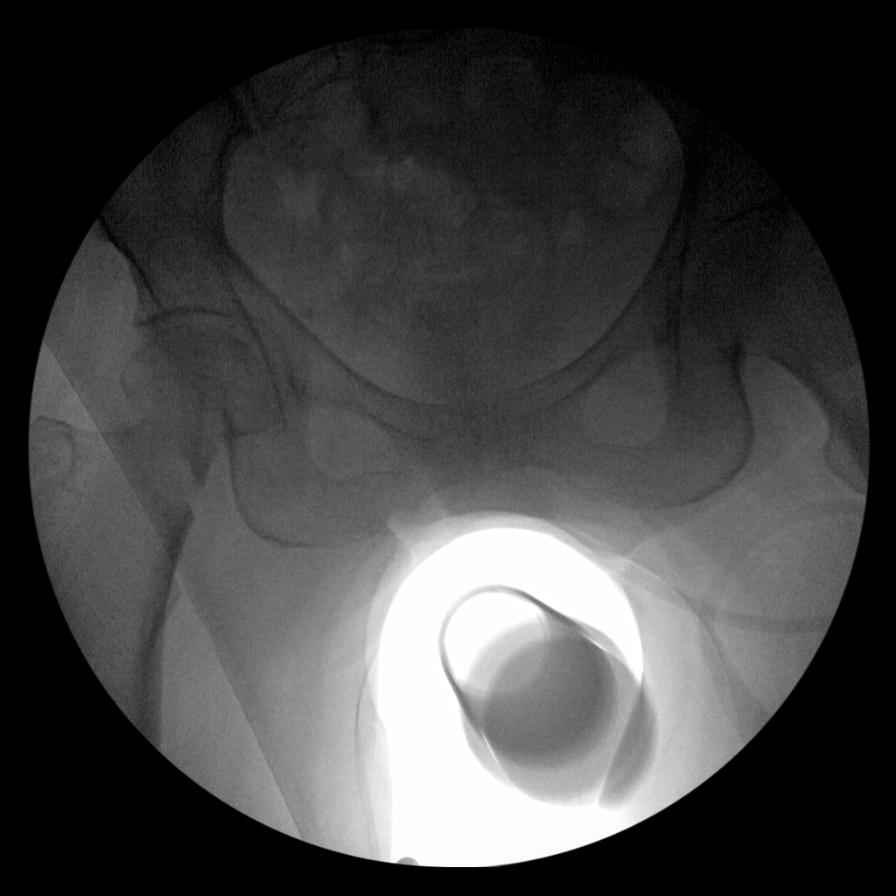
[im 2/6]
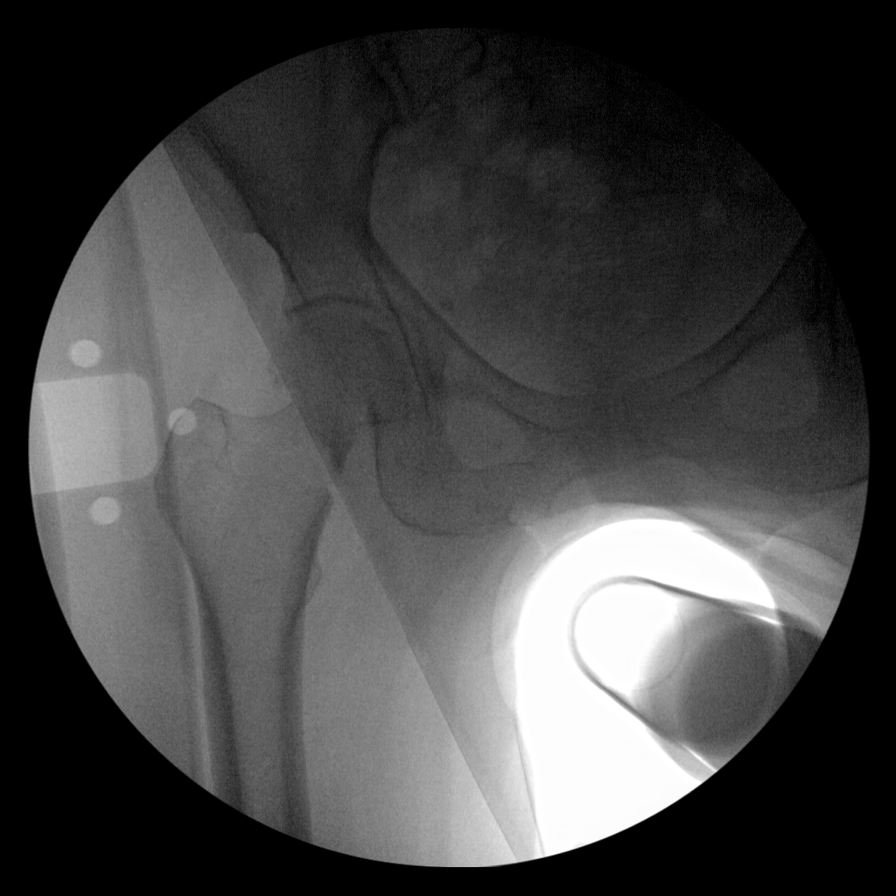
[im 3/6]
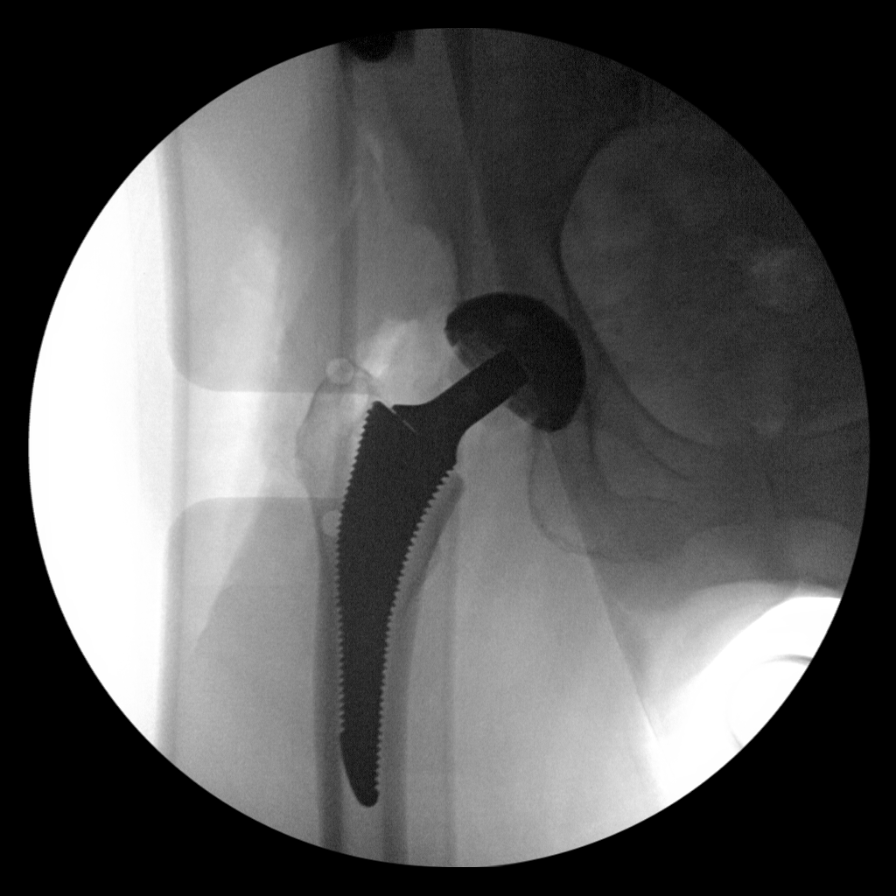
[im 4/6]
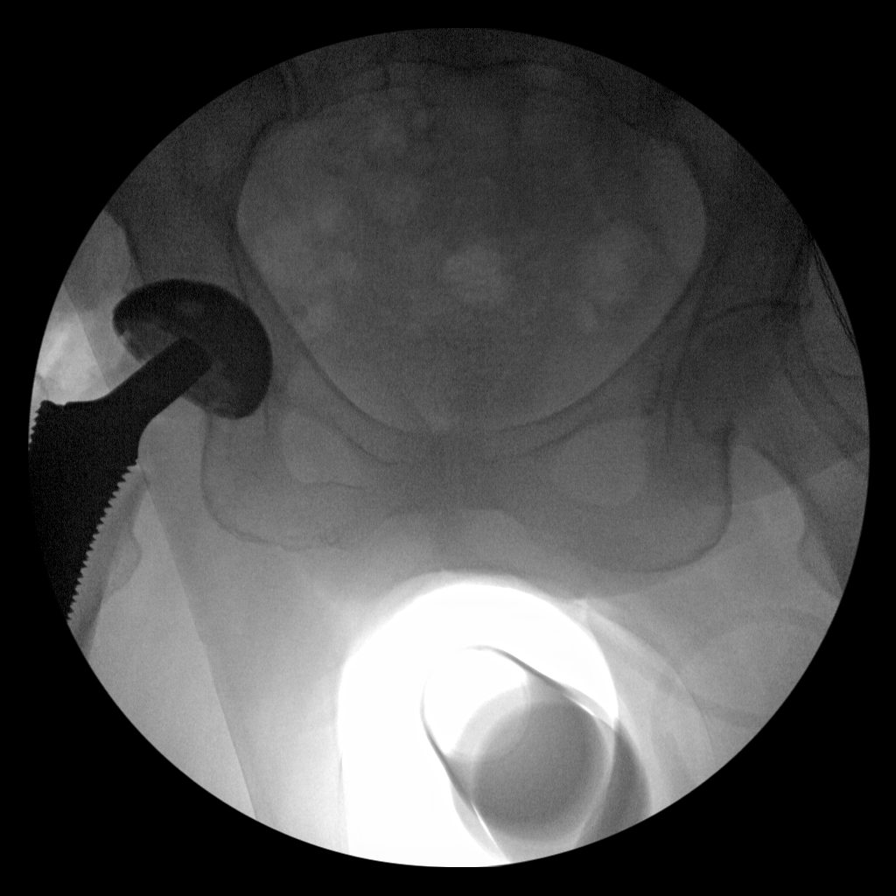
[im 5/6]
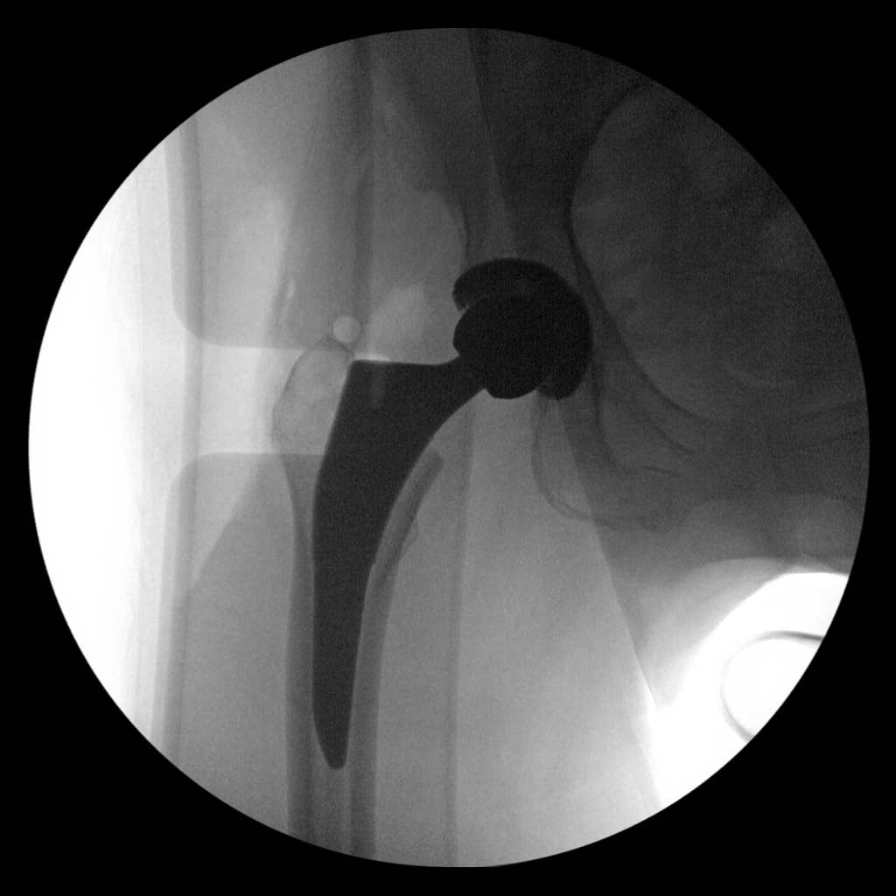
[im 6/6]
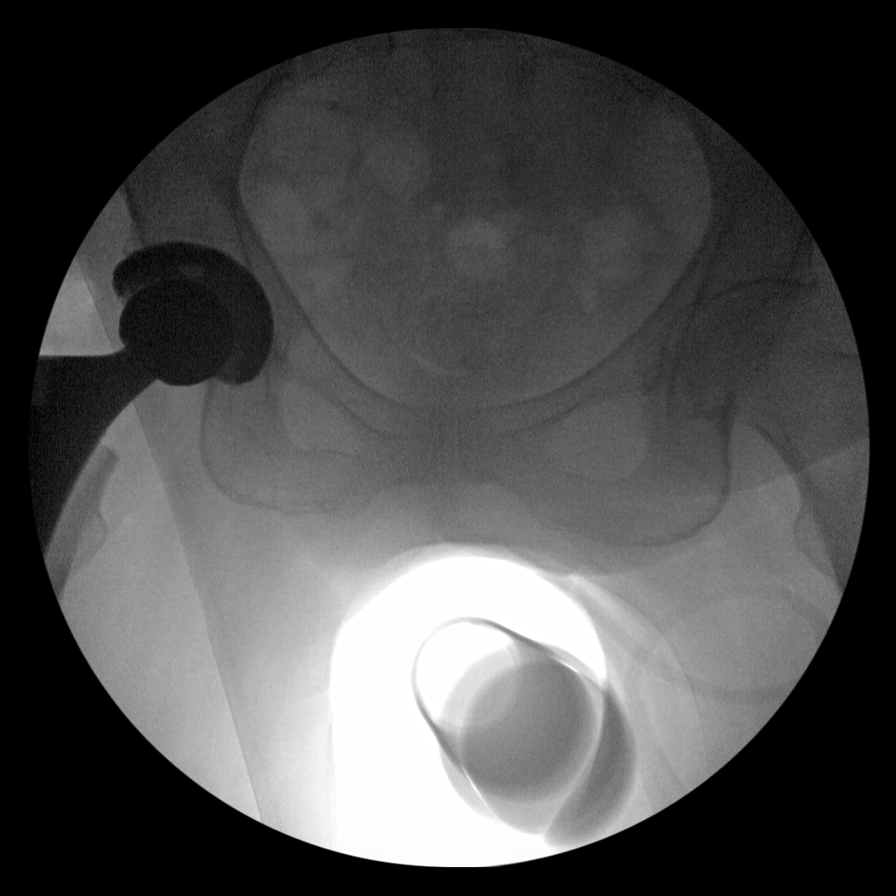

[6 of 6 positions shown; findings below may reference images not displayed]

FINDINGS: Multiple intraoperative fluoroscopic spot images demonstrate
surgical changes of right hip arthroplasty for repair of femoral
neck fracture.
IMPRESSION: Intraoperative fluoroscopic spot images demonstrate surgical changes
of right hip arthroplasty for repair of femoral neck fracture.

Please refer to the dictated operative report for full details of
intraoperative findings and procedure.

## 2020-06-17 DEATH — deceased

## 2021-03-18 DEATH — deceased
